# Patient Record
Sex: Female | Born: 1975 | Race: White | Hispanic: No | Marital: Married | Smoking: Never smoker
Health system: Southern US, Community
[De-identification: ages and names within clinical notes are randomized; demographics above are authoritative.]

## PROBLEM LIST (undated history)

## (undated) DIAGNOSIS — IMO0002 Reserved for concepts with insufficient information to code with codable children: Secondary | ICD-10-CM

## (undated) DIAGNOSIS — M5430 Sciatica, unspecified side: Secondary | ICD-10-CM

## (undated) DIAGNOSIS — F419 Anxiety disorder, unspecified: Secondary | ICD-10-CM

## (undated) DIAGNOSIS — M25569 Pain in unspecified knee: Secondary | ICD-10-CM

## (undated) DIAGNOSIS — F32A Depression, unspecified: Secondary | ICD-10-CM

## (undated) DIAGNOSIS — F329 Major depressive disorder, single episode, unspecified: Secondary | ICD-10-CM

## (undated) HISTORY — PX: ABDOMINAL HYSTERECTOMY: SHX81

---

## 2014-07-02 ENCOUNTER — Inpatient Hospital Stay
Admit: 2014-07-02 | Discharge: 2014-07-03 | Disposition: A | Discharge status: Home / Self Care | Payer: TRICARE (CHAMPUS) | Attending: Emergency Medicine

## 2014-07-02 DIAGNOSIS — G43101 Migraine with aura, not intractable, with status migrainosus: Secondary | ICD-10-CM

## 2014-07-02 LAB — METABOLIC PANEL, COMPREHENSIVE
A-G Ratio: 1.1 (ref 1.1–2.2)
ALT (SGPT): 19 U/L (ref 12–78)
AST (SGOT): 9 U/L — ABNORMAL LOW (ref 15–37)
Albumin: 4 g/dL (ref 3.5–5.0)
Alk. phosphatase: 57 U/L (ref 45–117)
Anion gap: 9 mmol/L (ref 5–15)
BUN/Creatinine ratio: 19 (ref 12–20)
BUN: 11 MG/DL (ref 6–20)
Bilirubin, total: 0.6 MG/DL (ref 0.2–1.0)
CO2: 25 mmol/L (ref 21–32)
Calcium: 8.9 MG/DL (ref 8.5–10.1)
Chloride: 106 mmol/L (ref 97–108)
Creatinine: 0.58 MG/DL (ref 0.55–1.02)
GFR est AA: >60 mL/min/{1.73_m2} (ref >60)
GFR est non-AA: >60 mL/min/{1.73_m2} (ref >60)
Globulin: 3.8 g/dL (ref 2.0–4.0)
Glucose: 105 mg/dL — ABNORMAL HIGH (ref 65–100)
Potassium: 3.7 mmol/L (ref 3.5–5.1)
Protein, total: 7.8 g/dL (ref 6.4–8.2)
Sodium: 140 mmol/L (ref 136–145)

## 2014-07-02 LAB — CBC WITH AUTOMATED DIFF
ABS. BASOPHILS: 0 10*3/uL (ref 0.0–0.1)
ABS. EOSINOPHILS: 0 10*3/uL (ref 0.0–0.4)
ABS. LYMPHOCYTES: 1.1 10*3/uL (ref 0.8–3.5)
ABS. MONOCYTES: 0.4 10*3/uL (ref 0.0–1.0)
ABS. NEUTROPHILS: 5.4 10*3/uL (ref 1.8–8.0)
BASOPHILS: 0 % (ref 0–1)
EOSINOPHILS: 0 % (ref 0–7)
HCT: 42.5 % (ref 35.0–47.0)
HGB: 14 g/dL (ref 11.5–16.0)
LYMPHOCYTES: 15 % (ref 12–49)
MCH: 32.4 PG (ref 26.0–34.0)
MCHC: 32.9 g/dL (ref 30.0–36.5)
MCV: 98.4 FL (ref 80.0–99.0)
MONOCYTES: 6 % (ref 5–13)
NEUTROPHILS: 79 % — ABNORMAL HIGH (ref 32–75)
PLATELET: 394 10*3/uL (ref 150–400)
RBC: 4.32 M/uL (ref 3.80–5.20)
RDW: 13.6 % (ref 11.5–14.5)
WBC: 6.9 10*3/uL (ref 3.6–11.0)

## 2014-07-02 LAB — LIPASE: Lipase: 91 U/L (ref 73–393)

## 2014-07-02 MED ORDER — KETOROLAC TROMETHAMINE 30 MG/ML INJECTION
30 mg/mL (1 mL) | INTRAMUSCULAR | Status: AC
Start: 2014-07-02 — End: 2014-07-02
  Administered 2014-07-02: via INTRAVENOUS

## 2014-07-02 MED ORDER — PROCHLORPERAZINE EDISYLATE 5 MG/ML INJECTION
5 mg/mL | Freq: Once | INTRAMUSCULAR | Status: AC
Start: 2014-07-02 — End: 2014-07-02
  Administered 2014-07-02: via INTRAVENOUS

## 2014-07-02 MED ORDER — SODIUM CHLORIDE 0.9% BOLUS IV
0.9 % | Freq: Once | INTRAVENOUS | Status: AC
Start: 2014-07-02 — End: 2014-07-02
  Administered 2014-07-02: via INTRAVENOUS

## 2014-07-02 MED ORDER — SODIUM CHLORIDE 0.9 % IV
5 mg/mL | Freq: Once | INTRAVENOUS | Status: DC
Start: 2014-07-02 — End: 2014-07-02

## 2014-07-02 MED ORDER — DIPHENHYDRAMINE HCL 50 MG/ML IJ SOLN
50 mg/mL | INTRAMUSCULAR | Status: AC
Start: 2014-07-02 — End: 2014-07-02
  Administered 2014-07-02: via INTRAVENOUS

## 2014-07-02 MED FILL — KETOROLAC TROMETHAMINE 30 MG/ML INJECTION: 30 mg/mL (1 mL) | INTRAMUSCULAR | Qty: 1

## 2014-07-02 MED FILL — SODIUM CHLORIDE 0.9 % IV: INTRAVENOUS | Qty: 1000

## 2014-07-02 MED FILL — PROCHLORPERAZINE EDISYLATE 5 MG/ML INJECTION: 5 mg/mL | INTRAMUSCULAR | Qty: 2

## 2014-07-02 MED FILL — DIPHENHYDRAMINE HCL 50 MG/ML IJ SOLN: 50 mg/mL | INTRAMUSCULAR | Qty: 1

## 2014-07-02 NOTE — ED Notes (Signed)
Pt given discharge instructions. Questions answered and pt states understanding, no distress noted, pt ambulated out of unit.

## 2014-07-02 NOTE — ED Provider Notes (Signed)
HPI Comments: 38 y.o. female with past medical history significant for migraines who presents from home with chief complaint of vomiting.  Pt states sudden onset this morning "around 0100" of nausea and vomiting.  Pt reports having constant vomiting and she states she has been unable to keeping down foods or liquids.  Pt states she feels "dehydrated" currently in the ED.  Pt states she took Zofran at home with no relief.  Pt states sudden onset "a couple minutes ago" of a HA, described as a migraine.  Pt reports having history of migraines and she states this HA feels similar to migraines she has experienced in the past.  Pt reports having allergies to onions and garlic and she believes these symptoms are associated with "something (she) ate" yesterday.  Pt denies having a history of diabetes or cancer.  Pt denies having diarrhea or fever.  There are no other acute medical concerns at this time.    Social hx: Tobacco: No, Alcohol: No    PCP: None    Note written by Juan QuamZachary A. Philippi, Neurosurgeoncribe, as dictated by Domingo CockingJeffrey T Jakub Debold, MD 6:17 PM            The history is provided by the patient and a relative.        Past Medical History   Diagnosis Date   ??? Migraine    ;     Past Surgical History   Procedure Laterality Date   ??? Hx hysterectomy           No family history on file.     History     Social History   ??? Marital Status: MARRIED     Spouse Name: N/A     Number of Children: N/A   ??? Years of Education: N/A     Occupational History   ??? Not on file.     Social History Main Topics   ??? Smoking status: Never Smoker    ??? Smokeless tobacco: Not on file   ??? Alcohol Use: No   ??? Drug Use: Not on file   ??? Sexual Activity: Not on file     Other Topics Concern   ??? Not on file     Social History Narrative   ??? No narrative on file        ALLERGIES: Penicillins      Review of Systems   Constitutional: Negative for fever and chills.   HENT: Negative for congestion and facial swelling.    Eyes: Negative for visual disturbance.    Respiratory: Negative for cough, chest tightness and shortness of breath.    Cardiovascular: Negative for chest pain.   Gastrointestinal: Positive for nausea and vomiting. Negative for abdominal pain and diarrhea.   Genitourinary: Negative for dysuria.   Musculoskeletal: Negative for arthralgias.   Skin: Negative for rash.   Neurological: Positive for headaches. Negative for dizziness.   Hematological: Negative for adenopathy.   Psychiatric/Behavioral: Negative for suicidal ideas.   All other systems reviewed and are negative.      Filed Vitals:    07/02/14 1656   BP: 119/82   Pulse: 77   Temp: 97.7 ??F (36.5 ??C)   Resp: 20   Height: 5\' 6"  (1.676 m)   Weight: 84.369 kg (186 lb)   SpO2: 99%            Physical Exam   Constitutional: She is oriented to person, place, and time. She appears well-developed and well-nourished.   Lying  on stretcher holding her head.   HENT:   Head: Normocephalic and atraumatic.   Mouth/Throat: Oropharynx is clear and moist.   Eyes: Pupils are equal, round, and reactive to light. No scleral icterus.   Neck: Normal range of motion. Neck supple. No thyromegaly present.   Cardiovascular: Normal rate, regular rhythm, normal heart sounds and intact distal pulses.    No murmur heard.  Pulmonary/Chest: Effort normal and breath sounds normal. No respiratory distress.   Abdominal: Soft. Bowel sounds are normal. She exhibits no distension. There is no tenderness.   Musculoskeletal: Normal range of motion. She exhibits no edema.   Neurological: She is alert and oriented to person, place, and time.     Mental Status:  Oriented to time, place and person.     Cranial Nerves: Intact visual fields. Fundi are benign. PERRL, EOM's full and intact, no nystagmus, no ptosis. Facial sensation is normal. Facial movement is symmetric. Palate is midline with normal sternocleidomastoid and trapezius muscle strength. Tongue is midline.     Motor:  5/5 strength in upper and lower proximal and distal muscles.  Normal bulk and tone.       Reflexes:  Biceps and quadriceps tendon reflexes 2+ and symmetrical.     Sensory:  Normal to light touch    Gait:   Normal gait.     Cerebellar:  Normal finger-to-nose and heal to shin.  Negative Romberg.       Skin: Skin is warm and dry. No rash noted. She is not diaphoretic.   Nursing note and vitals reviewed.  Note written by Juan QuamZachary A. Philippi, Neurosurgeoncribe, as dictated by Domingo CockingJeffrey T Tatianna Ibbotson, MD 6:17 PM     MDM  Number of Diagnoses or Management Options  Migraine with aura and with status migrainosus, not intractable:   Vomiting:   Diagnosis management comments: Assessment:  38 y.o. Female with no sig pmh presents with headache.  No relief from home medications.  Pt well appearing and in no distress.  Afebrile with normal VS.  No concerning features suggestive of serious intracranial pathology such as ICH, meningitis/encephalitis, tumor/mass, trauma.      DDx includes:  Migraine, tension, cluster, sinus disease, dehydration, visual disturbance, viral illness.      Plan:  IVF  Compazine  Benadryl  Toradol  Reassess        Procedures    7:29 PM  Patient resting comfortably with no complaints at this time.  VS remain stable.  Repeat physical exam is unremarkable.  Pt ambulatory without difficulty and tolerating po's well.

## 2014-07-03 MED ORDER — PROCHLORPERAZINE MALEATE 10 MG TAB
10 mg | ORAL_TABLET | Freq: Three times a day (TID) | ORAL | Status: AC | PRN
Start: 2014-07-03 — End: 2014-07-09

## 2014-08-28 HISTORY — PX: MR LOWER LEG RIGHT (ARMC HX): HXRAD1785

## 2016-01-17 ENCOUNTER — Emergency Department (HOSPITAL_COMMUNITY)
Admission: EM | Admit: 2016-01-17 | Discharge: 2016-01-17 | Disposition: A | Discharge status: Home / Self Care | Attending: Emergency Medicine | Admitting: Emergency Medicine

## 2016-01-17 ENCOUNTER — Encounter (HOSPITAL_COMMUNITY): Payer: Self-pay | Admitting: Emergency Medicine

## 2016-01-17 ENCOUNTER — Emergency Department (HOSPITAL_COMMUNITY)

## 2016-01-17 DIAGNOSIS — R200 Anesthesia of skin: Secondary | ICD-10-CM | POA: Diagnosis not present

## 2016-01-17 DIAGNOSIS — G8929 Other chronic pain: Secondary | ICD-10-CM | POA: Diagnosis not present

## 2016-01-17 DIAGNOSIS — R110 Nausea: Secondary | ICD-10-CM | POA: Insufficient documentation

## 2016-01-17 DIAGNOSIS — Y9289 Other specified places as the place of occurrence of the external cause: Secondary | ICD-10-CM | POA: Insufficient documentation

## 2016-01-17 DIAGNOSIS — S8991XA Unspecified injury of right lower leg, initial encounter: Secondary | ICD-10-CM | POA: Diagnosis present

## 2016-01-17 DIAGNOSIS — Y9389 Activity, other specified: Secondary | ICD-10-CM | POA: Diagnosis not present

## 2016-01-17 DIAGNOSIS — S8391XA Sprain of unspecified site of right knee, initial encounter: Secondary | ICD-10-CM | POA: Insufficient documentation

## 2016-01-17 DIAGNOSIS — Y998 Other external cause status: Secondary | ICD-10-CM | POA: Insufficient documentation

## 2016-01-17 DIAGNOSIS — X58XXXA Exposure to other specified factors, initial encounter: Secondary | ICD-10-CM | POA: Insufficient documentation

## 2016-01-17 DIAGNOSIS — Z8739 Personal history of other diseases of the musculoskeletal system and connective tissue: Secondary | ICD-10-CM | POA: Diagnosis not present

## 2016-01-17 DIAGNOSIS — Z880 Allergy status to penicillin: Secondary | ICD-10-CM | POA: Diagnosis not present

## 2016-01-17 HISTORY — DX: Pain in unspecified knee: M25.569

## 2016-01-17 HISTORY — DX: Reserved for concepts with insufficient information to code with codable children: IMO0002

## 2016-01-17 HISTORY — DX: Sciatica, unspecified side: M54.30

## 2016-01-17 MED ORDER — ONDANSETRON HCL 4 MG/2ML IJ SOLN
4.0000 mg | Freq: Once | INTRAMUSCULAR | Status: AC
  Administered 2016-01-17: 4 mg via INTRAVENOUS
  Filled 2016-01-17: qty 2

## 2016-01-17 MED ORDER — FENTANYL CITRATE (PF) 100 MCG/2ML IJ SOLN
100.0000 ug | Freq: Once | INTRAMUSCULAR | Status: AC
  Administered 2016-01-17: 100 ug via INTRAVENOUS
  Filled 2016-01-17: qty 2

## 2016-01-17 MED ORDER — NAPROXEN 500 MG PO TABS: 500.0000 mg | ORAL_TABLET | Freq: Two times a day (BID) | ORAL | Status: DC

## 2016-01-17 MED ORDER — ACETAMINOPHEN-CODEINE #3 300-30 MG PO TABS: 1.0000 | ORAL_TABLET | Freq: Four times a day (QID) | ORAL | Status: DC | PRN

## 2016-01-17 MED ORDER — ACETAMINOPHEN-CODEINE #3 300-30 MG PO TABS
2.0000 | ORAL_TABLET | Freq: Once | ORAL | Status: AC
  Administered 2016-01-17: 2 via ORAL
  Filled 2016-01-17: qty 2

## 2016-01-17 NOTE — Progress Notes (Signed)
Orthopedic Tech Progress Note Patient Details:  Colleen HollerChristine Hoffman Nov 16, 1975 [id number]  Ortho Devices Type of Ortho Device: Knee Immobilizer, Crutches Ortho Device/Splint Location: rle Ortho Device/Splint Interventions: Application   Colleen Hoffman 01/17/2016, 11:45 AM
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
## 2016-02-11 ENCOUNTER — Emergency Department (HOSPITAL_COMMUNITY)

## 2016-02-11 ENCOUNTER — Encounter (HOSPITAL_COMMUNITY): Payer: Self-pay

## 2016-02-11 ENCOUNTER — Emergency Department (HOSPITAL_COMMUNITY)
Admission: EM | Admit: 2016-02-11 | Discharge: 2016-02-11 | Disposition: A | Discharge status: Home / Self Care | Attending: Emergency Medicine | Admitting: Emergency Medicine

## 2016-02-11 DIAGNOSIS — Y92009 Unspecified place in unspecified non-institutional (private) residence as the place of occurrence of the external cause: Secondary | ICD-10-CM | POA: Insufficient documentation

## 2016-02-11 DIAGNOSIS — S82891A Other fracture of right lower leg, initial encounter for closed fracture: Secondary | ICD-10-CM

## 2016-02-11 DIAGNOSIS — S82831A Other fracture of upper and lower end of right fibula, initial encounter for closed fracture: Secondary | ICD-10-CM | POA: Diagnosis not present

## 2016-02-11 DIAGNOSIS — Y999 Unspecified external cause status: Secondary | ICD-10-CM | POA: Diagnosis not present

## 2016-02-11 DIAGNOSIS — Y939 Activity, unspecified: Secondary | ICD-10-CM | POA: Diagnosis not present

## 2016-02-11 DIAGNOSIS — S99911A Unspecified injury of right ankle, initial encounter: Secondary | ICD-10-CM | POA: Diagnosis present

## 2016-02-11 DIAGNOSIS — W19XXXA Unspecified fall, initial encounter: Secondary | ICD-10-CM

## 2016-02-11 DIAGNOSIS — W108XXA Fall (on) (from) other stairs and steps, initial encounter: Secondary | ICD-10-CM | POA: Diagnosis not present

## 2016-02-11 MED ORDER — HYDROCODONE-ACETAMINOPHEN 5-325 MG PO TABS: 1.0000 | ORAL_TABLET | ORAL | Status: DC | PRN

## 2016-02-11 MED ORDER — MELOXICAM 15 MG PO TABS: 15.0000 mg | ORAL_TABLET | Freq: Every day | ORAL | Status: DC | PRN

## 2016-02-11 MED ORDER — HYDROMORPHONE HCL 1 MG/ML IJ SOLN
1.0000 mg | Freq: Once | INTRAMUSCULAR | Status: AC
  Administered 2016-02-11: 1 mg via INTRAMUSCULAR
  Filled 2016-02-11: qty 1

## 2016-02-11 MED ORDER — DIAZEPAM 5 MG/ML IJ SOLN
5.0000 mg | Freq: Once | INTRAMUSCULAR | Status: AC
  Administered 2016-02-11: 5 mg via INTRAMUSCULAR
  Filled 2016-02-11: qty 2

## 2016-02-11 MED ORDER — KETOROLAC TROMETHAMINE 30 MG/ML IJ SOLN
30.0000 mg | Freq: Once | INTRAMUSCULAR | Status: AC
  Administered 2016-02-11: 30 mg via INTRAMUSCULAR
  Filled 2016-02-11: qty 1

## 2016-02-11 NOTE — ED Notes (Signed)
Paged and spoke with Ortho will arrive to place splint

## 2016-02-11 NOTE — ED Notes (Signed)
Doctor at bedside.

## 2016-02-11 NOTE — Discharge Instructions (Signed)
Cast or Splint Care °Casts and splints support injured limbs and keep bones from moving while they heal.  °HOME CARE °· Keep the cast or splint uncovered during the drying period. °¨ A plaster cast can take 24 to 48 hours to dry. °¨ A fiberglass cast will dry in less than 1 hour. °· Do not rest the cast on anything harder than a pillow for 24 hours. °· Do not put weight on your injured limb. Do not put pressure on the cast. Wait for your doctor's approval. °· Keep the cast or splint dry. °¨ Cover the cast or splint with a plastic bag during baths or wet weather. °¨ If you have a cast over your chest and belly (trunk), take sponge baths until the cast is taken off. °¨ If your cast gets wet, dry it with a towel or blow dryer. Use the cool setting on the blow dryer. °· Keep your cast or splint clean. Wash a dirty cast with a damp cloth. °· Do not put any objects under your cast or splint. °· Do not scratch the skin under the cast with an object. If itching is a problem, use a blow dryer on a cool setting over the itchy area. °· Do not trim or cut your cast. °· Do not take out the padding from inside your cast. °· Exercise your joints near the cast as told by your doctor. °· Raise (elevate) your injured limb on 1 or 2 pillows for the first 1 to 3 days. °GET HELP IF: °· Your cast or splint cracks. °· Your cast or splint is too tight or too loose. °· You itch badly under the cast. °· Your cast gets wet or has a soft spot. °· You have a bad smell coming from the cast. °· You get an object stuck under the cast. °· Your skin around the cast becomes red or sore. °· You have new or more pain after the cast is put on. °GET HELP RIGHT AWAY IF: °· You have fluid leaking through the cast. °· You cannot move your fingers or toes. °· Your fingers or toes turn blue or white or are cool, painful, or puffy (swollen). °· You have tingling or lose feeling (numbness) around the injured area. °· You have bad pain or pressure under the  cast. °· You have trouble breathing or have shortness of breath. °· You have chest pain. °  °This information is not intended to replace advice given to you by your health care provider. Make sure you discuss any questions you have with your health care provider. °  °Document Released: 12/14/2010 Document Revised: 04/16/2013 Document Reviewed: 02/20/2013 °Elsevier Interactive Patient Education ©2016 [employer] ° °
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
## 2016-02-11 NOTE — Progress Notes (Signed)
Orthopedic Tech Progress Note Patient Details:  Rod HollerChristine Schlottman 11/15/1975 [id number]  Ortho Devices Type of Ortho Device: Ace wrap, Post (short leg) splint, Stirrup splint Ortho Device/Splint Location: rle Ortho Device/Splint Interventions: Application   Brentton Wardlow 02/11/2016, 10:32 AM

## 2016-02-11 NOTE — ED Notes (Signed)
Pt presents with R ankle pain and swelling after falling down 6-7 stairs at home.  -LOC; unable to bear weight.

## 2016-02-11 NOTE — ED Notes (Signed)
Patient has own crutches.

## 2016-02-13 ENCOUNTER — Encounter (HOSPITAL_COMMUNITY): Payer: Self-pay | Admitting: Emergency Medicine

## 2016-02-13 ENCOUNTER — Emergency Department (HOSPITAL_COMMUNITY)

## 2016-02-13 ENCOUNTER — Emergency Department (HOSPITAL_COMMUNITY)
Admission: EM | Admit: 2016-02-13 | Discharge: 2016-02-13 | Disposition: A | Discharge status: Home / Self Care | Attending: Emergency Medicine | Admitting: Emergency Medicine

## 2016-02-13 DIAGNOSIS — Y999 Unspecified external cause status: Secondary | ICD-10-CM | POA: Diagnosis not present

## 2016-02-13 DIAGNOSIS — Y939 Activity, unspecified: Secondary | ICD-10-CM | POA: Diagnosis not present

## 2016-02-13 DIAGNOSIS — Y92009 Unspecified place in unspecified non-institutional (private) residence as the place of occurrence of the external cause: Secondary | ICD-10-CM | POA: Insufficient documentation

## 2016-02-13 DIAGNOSIS — S8991XD Unspecified injury of right lower leg, subsequent encounter: Secondary | ICD-10-CM | POA: Diagnosis present

## 2016-02-13 DIAGNOSIS — W19XXXD Unspecified fall, subsequent encounter: Secondary | ICD-10-CM | POA: Diagnosis not present

## 2016-02-13 DIAGNOSIS — S82301G Unspecified fracture of lower end of right tibia, subsequent encounter for closed fracture with delayed healing: Secondary | ICD-10-CM

## 2016-02-13 DIAGNOSIS — S8251XG Displaced fracture of medial malleolus of right tibia, subsequent encounter for closed fracture with delayed healing: Secondary | ICD-10-CM | POA: Insufficient documentation

## 2016-02-13 MED ORDER — KETOROLAC TROMETHAMINE 30 MG/ML IJ SOLN
30.0000 mg | Freq: Once | INTRAMUSCULAR | Status: AC
  Administered 2016-02-13: 30 mg via INTRAVENOUS
  Filled 2016-02-13: qty 1

## 2016-02-13 MED ORDER — HYDROMORPHONE HCL 1 MG/ML IJ SOLN
1.0000 mg | Freq: Once | INTRAMUSCULAR | Status: AC
  Administered 2016-02-13: 1 mg via INTRAMUSCULAR
  Filled 2016-02-13: qty 1

## 2016-02-13 MED ORDER — IBUPROFEN 400 MG PO TABS: 400.0000 mg | ORAL_TABLET | Freq: Four times a day (QID) | ORAL | Status: DC | PRN

## 2016-02-13 MED ORDER — LORAZEPAM 2 MG/ML IJ SOLN
1.0000 mg | Freq: Once | INTRAMUSCULAR | Status: DC
  Filled 2016-02-13: qty 1

## 2016-02-13 MED ORDER — LORAZEPAM 2 MG/ML IJ SOLN
0.5000 mg | Freq: Once | INTRAMUSCULAR | Status: AC
  Administered 2016-02-13: 0.5 mg via INTRAVENOUS

## 2016-02-13 MED ORDER — HYDROCODONE-ACETAMINOPHEN 5-325 MG PO TABS
2.0000 | ORAL_TABLET | Freq: Once | ORAL | Status: AC
  Administered 2016-02-13: 2 via ORAL
  Filled 2016-02-13: qty 2

## 2016-02-13 MED ORDER — DIPHENHYDRAMINE HCL 25 MG PO CAPS
25.0000 mg | ORAL_CAPSULE | Freq: Once | ORAL | Status: AC
  Administered 2016-02-13: 25 mg via ORAL
  Filled 2016-02-13: qty 1

## 2016-02-13 MED ORDER — ETOMIDATE 2 MG/ML IV SOLN
12.0000 mg | Freq: Once | INTRAVENOUS | Status: AC
  Administered 2016-02-13: 12 mg via INTRAVENOUS
  Filled 2016-02-13: qty 10

## 2016-02-13 MED ORDER — LORAZEPAM 2 MG/ML IJ SOLN
INTRAMUSCULAR | Status: DC | PRN
  Administered 2016-02-13: 0.5 mg via INTRAVENOUS

## 2016-02-13 MED ORDER — HYDROCODONE-ACETAMINOPHEN 5-325 MG PO TABS: 1.0000 | ORAL_TABLET | ORAL | Status: DC | PRN

## 2016-02-13 NOTE — Sedation Documentation (Signed)
Patient is resting comfortably. 
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
## 2016-02-13 NOTE — Progress Notes (Signed)
Orthopedic Tech Progress Note Patient Details:  Rod HollerChristine Labell [birth year] [id number] Assisted MD with reduction, then placed posterior fiberglass short leg splint with fiberglass stirrup splint to RLE.  Pulses, sensation, motion intact before and after splinting.  Capillary refill less than 2 seconds before and after splinting. Ortho Devices Type of Ortho Device: Post (short leg) splint, Stirrup splint Ortho Device/Splint Location: RLE Ortho Device/Splint Interventions: Application   Lesle ChrisGilliland, Emelee Rodocker L 02/13/2016, 9:16 PM

## 2016-02-13 NOTE — Sedation Documentation (Signed)
Vital signs stable. 

## 2016-02-13 NOTE — ED Notes (Signed)
Pt sts increased pain in right ankle at home and fall x 2; pt sts seen here Friday with fracture to right ankle

## 2016-02-13 NOTE — Discharge Instructions (Signed)
°Cast or Splint Care  ° ° °Casts and splints support injured limbs and keep bones from moving while they heal. It is important to care for your cast or splint at home.  °HOME CARE INSTRUCTIONS  °Keep the cast or splint uncovered during the drying period. It can take 24 to 48 hours to dry if it is made of plaster. A fiberglass cast will dry in less than 1 hour.  °Do not rest the cast on anything harder than a pillow for the first 24 hours.  °Do not put weight on your injured limb or apply pressure to the cast until your health care provider gives you permission.  °Keep the cast or splint dry. Wet casts or splints can lose their shape and may not support the limb as well. A wet cast that has lost its shape can also create harmful pressure on your skin when it dries. Also, wet skin can become infected.  °Cover the cast or splint with a plastic bag when bathing or when out in the rain or snow. If the cast is on the trunk of the body, take sponge baths until the cast is removed.  °If your cast does become wet, dry it with a towel or a blow dryer on the cool setting only. °Keep your cast or splint clean. Soiled casts may be wiped with a moistened cloth.  °Do not place any hard or soft foreign objects under your cast or splint, such as cotton, toilet paper, lotion, or powder.  °Do not try to scratch the skin under the cast with any object. The object could get stuck inside the cast. Also, scratching could lead to an infection. If itching is a problem, use a blow dryer on a cool setting to relieve discomfort.  °Do not trim or cut your cast or remove padding from inside of it.  °Exercise all joints next to the injury that are not immobilized by the cast or splint. For example, if you have a long leg cast, exercise the hip joint and toes. If you have an arm cast or splint, exercise the shoulder, elbow, thumb, and fingers.  °Elevate your injured arm or leg on 1 or 2 pillows for the first 1 to 3 days to decrease swelling and  pain. It is best if you can comfortably elevate your cast so it is higher than your heart. °SEEK MEDICAL CARE IF:  °Your cast or splint cracks.  °Your cast or splint is too tight or too loose.  °You have unbearable itching inside the cast.  °Your cast becomes wet or develops a soft spot or area.  °You have a bad smell coming from inside your cast.  °You get an object stuck under your cast.  °Your skin around the cast becomes red or raw.  °You have new pain or worsening pain after the cast has been applied. °SEEK IMMEDIATE MEDICAL CARE IF:  °You have fluid leaking through the cast.  °You are unable to move your fingers or toes.  °You have discolored (blue or white), cool, painful, or very swollen fingers or toes beyond the cast.  °You have tingling or numbness around the injured area.  °You have severe pain or pressure under the cast.  °You have any difficulty with your breathing or have shortness of breath.  °You have chest pain. °This information is not intended to replace advice given to you by your health care provider. Make sure you discuss any questions you have with your health care provider.  °  Document Released: 08/11/2000 Document Revised: 06/04/2013 Document Reviewed: 02/20/2013  °Elsevier Interactive Patient Education ©2016 [employer]  ° °

## 2017-06-06 IMAGING — CR DG ANKLE COMPLETE 3+V*R*
3 series · 3 of 3 positions shown · non-contrast
Comparison: 02/11/2016

CLINICAL DATA: Injured right ankle 2 days ago.

EXAM:
RIGHT ANKLE - COMPLETE 3+ VIEW

[ankle ap]
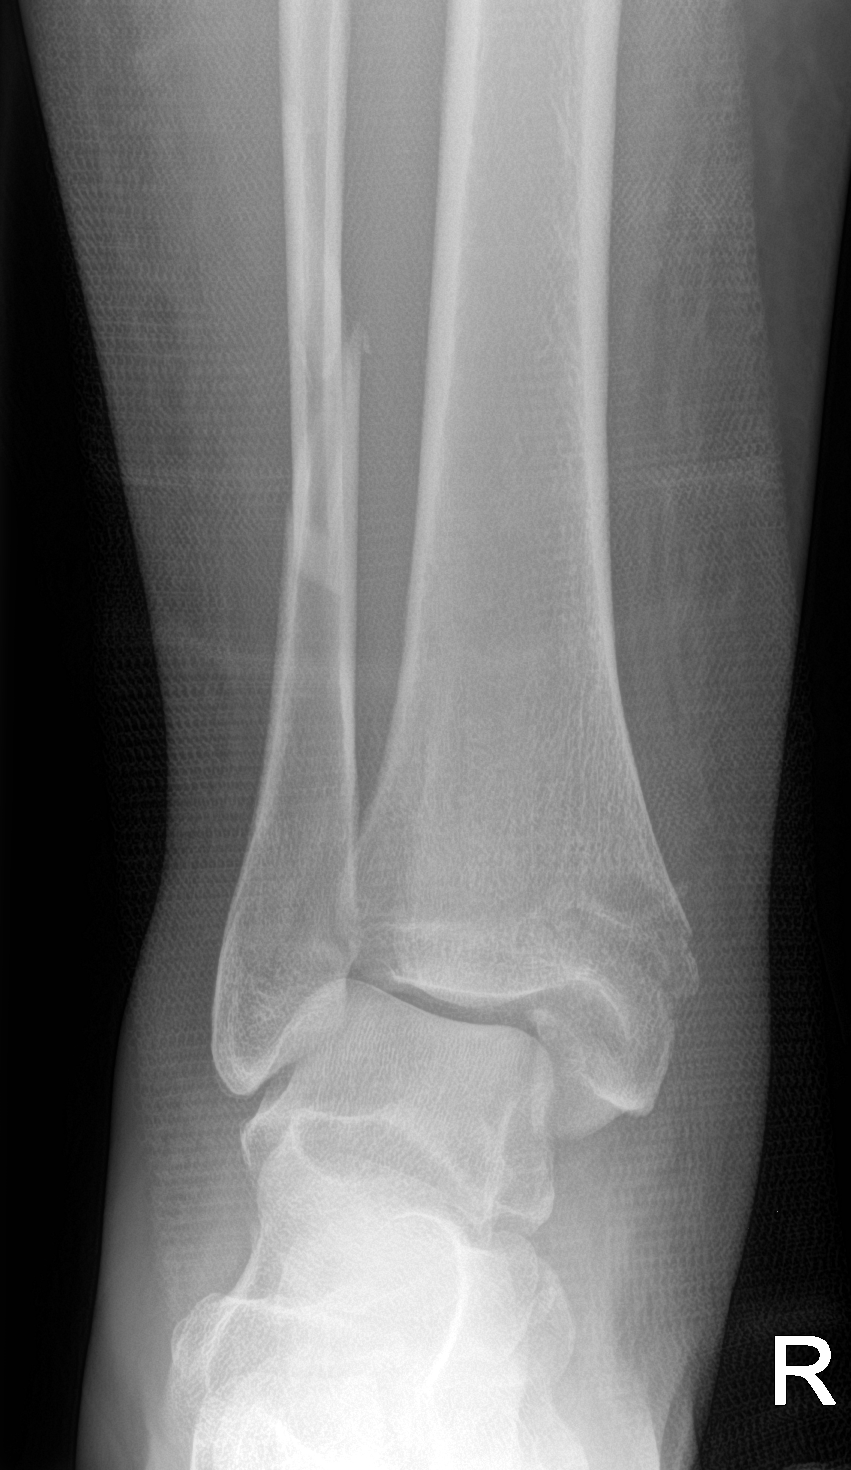

[ankle obl]
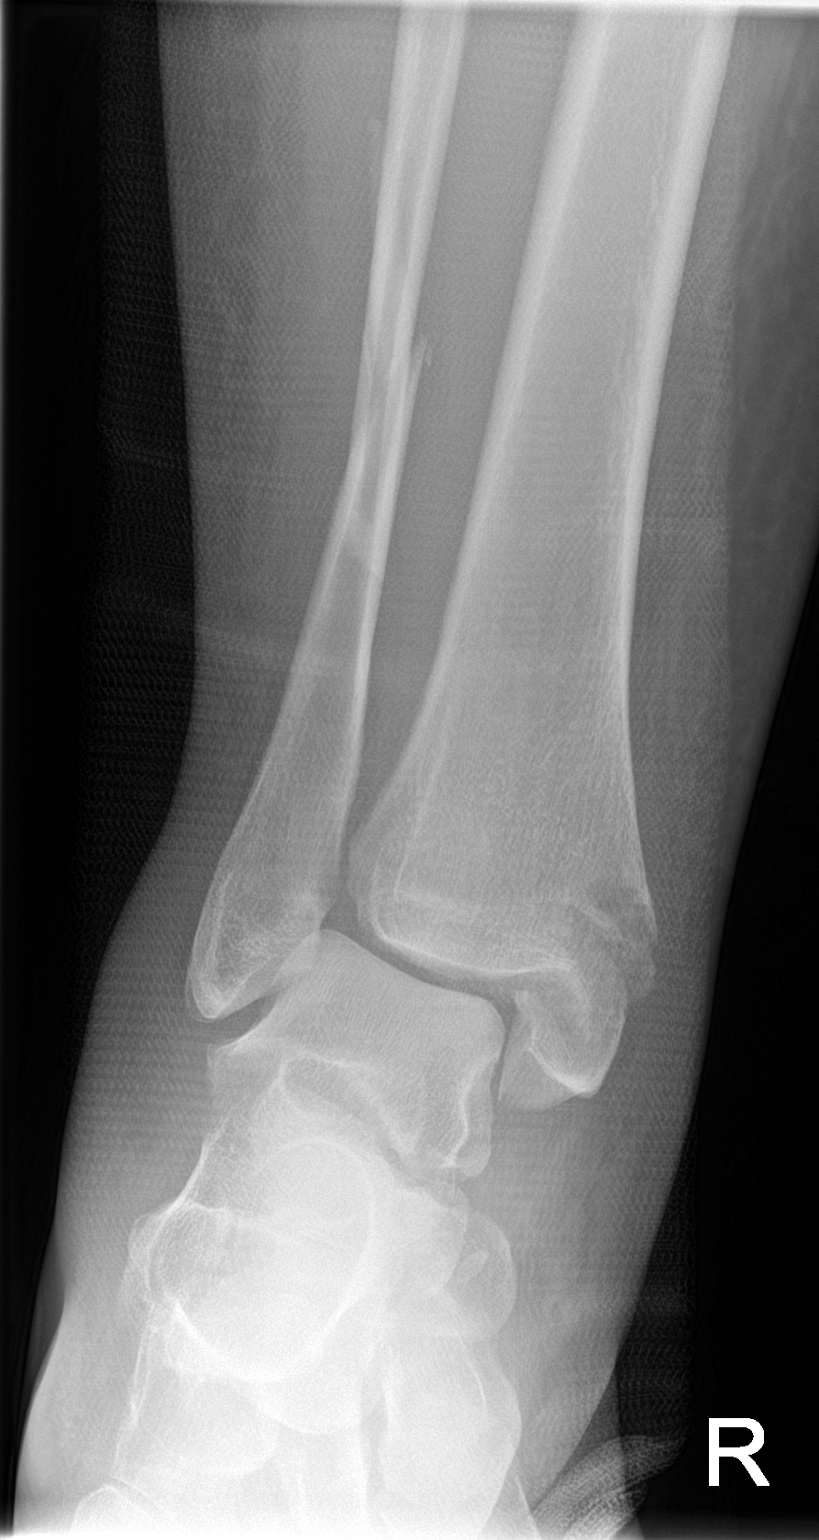

[ankle lat]
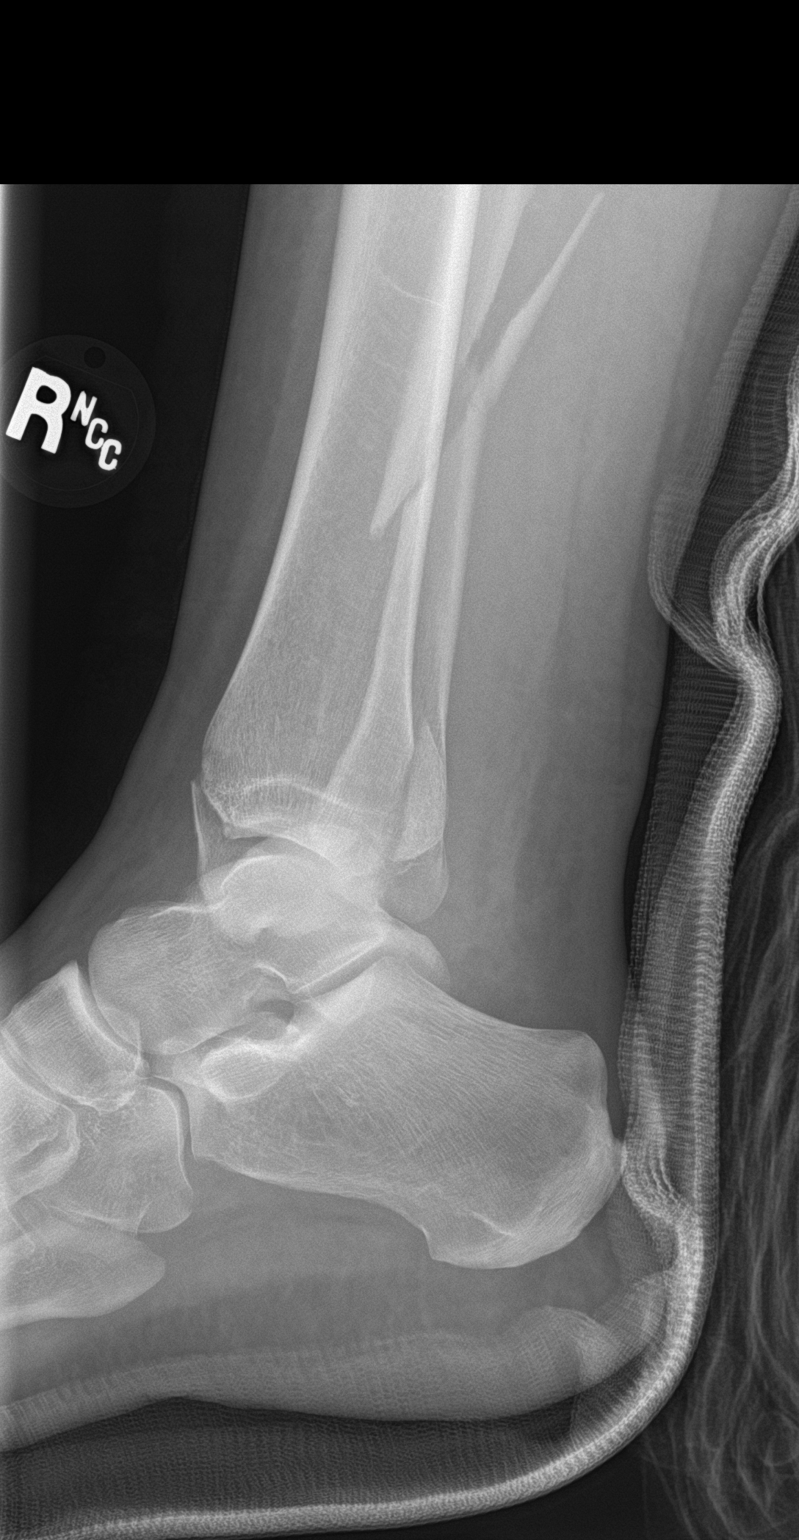

[3 of 3 positions shown; findings below may reference images not displayed]

FINDINGS: There is an overlying cast obscuring evaluation of bony detail. Exam
again demonstrates patient's displaced oblique fracture of the
distal fibular diaphysis. There is mild increased posterior
displacement of a fragment at this fracture site. There is evidence
of patient's displaced medial malleolar fracture with slight
increased displacement compared to the previous exam. Slight
increased widening of the medial aspect of the ankle mortise
compared to the previous exam. There is evidence of a posterior
malleolar fracture which was present, but not well visualized on
previous exam.
IMPRESSION: Slight worsening displacement of posterior fracture fragment
associated with patient's distal fibular diaphyseal fracture. Slight
worsening displacement of known medial malleolar fracture with
slight increased widening of the medial ankle mortise.

Displaced posterior malleolar fracture.

## 2018-03-28 ENCOUNTER — Encounter (HOSPITAL_BASED_OUTPATIENT_CLINIC_OR_DEPARTMENT_OTHER): Payer: Self-pay | Admitting: *Deleted

## 2018-03-28 ENCOUNTER — Other Ambulatory Visit: Payer: Self-pay

## 2018-04-03 ENCOUNTER — Other Ambulatory Visit: Payer: Self-pay | Admitting: Anesthesiology

## 2018-04-05 ENCOUNTER — Ambulatory Visit (HOSPITAL_BASED_OUTPATIENT_CLINIC_OR_DEPARTMENT_OTHER): Payer: Commercial Managed Care - PPO | Admitting: Certified Registered"

## 2018-04-05 ENCOUNTER — Encounter (HOSPITAL_BASED_OUTPATIENT_CLINIC_OR_DEPARTMENT_OTHER): Payer: Self-pay | Admitting: Certified Registered"

## 2018-04-05 ENCOUNTER — Ambulatory Visit (HOSPITAL_BASED_OUTPATIENT_CLINIC_OR_DEPARTMENT_OTHER)
Admission: RE | Admit: 2018-04-05 | Discharge: 2018-04-05 | Disposition: A | Discharge status: Home / Self Care | Payer: Commercial Managed Care - PPO | Source: Ambulatory Visit | Attending: Anesthesiology | Admitting: Anesthesiology

## 2018-04-05 ENCOUNTER — Ambulatory Visit (HOSPITAL_COMMUNITY): Payer: Commercial Managed Care - PPO

## 2018-04-05 ENCOUNTER — Other Ambulatory Visit: Payer: Self-pay

## 2018-04-05 ENCOUNTER — Encounter (HOSPITAL_BASED_OUTPATIENT_CLINIC_OR_DEPARTMENT_OTHER)
Admission: RE | Disposition: A | Discharge status: Home / Self Care | Payer: Self-pay | Source: Ambulatory Visit | Attending: Anesthesiology

## 2018-04-05 DIAGNOSIS — Z419 Encounter for procedure for purposes other than remedying health state, unspecified: Secondary | ICD-10-CM

## 2018-04-05 DIAGNOSIS — Z888 Allergy status to other drugs, medicaments and biological substances status: Secondary | ICD-10-CM | POA: Insufficient documentation

## 2018-04-05 DIAGNOSIS — G90521 Complex regional pain syndrome I of right lower limb: Secondary | ICD-10-CM | POA: Insufficient documentation

## 2018-04-05 DIAGNOSIS — Z885 Allergy status to narcotic agent status: Secondary | ICD-10-CM | POA: Diagnosis not present

## 2018-04-05 DIAGNOSIS — Z880 Allergy status to penicillin: Secondary | ICD-10-CM | POA: Insufficient documentation

## 2018-04-05 DIAGNOSIS — Z6835 Body mass index (BMI) 35.0-35.9, adult: Secondary | ICD-10-CM | POA: Diagnosis not present

## 2018-04-05 DIAGNOSIS — Z79899 Other long term (current) drug therapy: Secondary | ICD-10-CM | POA: Insufficient documentation

## 2018-04-05 HISTORY — PX: SPINAL CORD STIMULATOR TRIAL: SHX5380

## 2018-04-05 SURGERY — LUMBAR SPINAL CORD STIMULATOR TRIAL
Anesthesia: Monitor Anesthesia Care | Site: Back

## 2018-04-05 MED ORDER — MIDAZOLAM HCL 2 MG/2ML IJ SOLN
1.0000 mg | INTRAMUSCULAR | Status: DC | PRN
  Administered 2018-04-05 (×2): 1 mg via INTRAVENOUS

## 2018-04-05 MED ORDER — FENTANYL CITRATE (PF) 100 MCG/2ML IJ SOLN: 25.0000 ug | INTRAMUSCULAR | Status: DC | PRN

## 2018-04-05 MED ORDER — BUPIVACAINE-EPINEPHRINE (PF) 0.25% -1:200000 IJ SOLN
INTRAMUSCULAR | Status: DC | PRN
  Administered 2018-04-05: 10 mL

## 2018-04-05 MED ORDER — PROPOFOL 10 MG/ML IV BOLUS
INTRAVENOUS | Status: DC | PRN
  Administered 2018-04-05 (×24): 10 mg via INTRAVENOUS

## 2018-04-05 MED ORDER — HYDROCODONE-ACETAMINOPHEN 7.5-325 MG PO TABS: 1.0000 | ORAL_TABLET | Freq: Once | ORAL | Status: DC | PRN

## 2018-04-05 MED ORDER — FENTANYL CITRATE (PF) 100 MCG/2ML IJ SOLN
50.0000 ug | INTRAMUSCULAR | Status: DC | PRN
  Administered 2018-04-05 (×2): 25 ug via INTRAVENOUS

## 2018-04-05 MED ORDER — VANCOMYCIN HCL IN DEXTROSE 1-5 GM/200ML-% IV SOLN: 1000.0000 mg | INTRAVENOUS | Status: DC

## 2018-04-05 MED ORDER — HYDROCODONE-ACETAMINOPHEN 5-325 MG PO TABS: 1.0000 | ORAL_TABLET | ORAL | 0 refills | Status: DC | PRN

## 2018-04-05 MED ORDER — BUPIVACAINE-EPINEPHRINE (PF) 0.5% -1:200000 IJ SOLN
INTRAMUSCULAR | Status: AC
  Filled 2018-04-05: qty 30

## 2018-04-05 MED ORDER — LACTATED RINGERS IV SOLN
INTRAVENOUS | Status: DC
  Administered 2018-04-05 (×2): via INTRAVENOUS

## 2018-04-05 MED ORDER — CHLORHEXIDINE GLUCONATE CLOTH 2 % EX PADS: 6.0000 | MEDICATED_PAD | Freq: Once | CUTANEOUS | Status: DC

## 2018-04-05 MED ORDER — MIDAZOLAM HCL 2 MG/2ML IJ SOLN
INTRAMUSCULAR | Status: AC
  Filled 2018-04-05: qty 2

## 2018-04-05 MED ORDER — ONDANSETRON HCL 4 MG/2ML IJ SOLN
INTRAMUSCULAR | Status: DC | PRN
  Administered 2018-04-05: 4 mg via INTRAVENOUS

## 2018-04-05 MED ORDER — SODIUM CHLORIDE 0.9 % IJ SOLN
INTRAMUSCULAR | Status: AC
  Filled 2018-04-05: qty 10

## 2018-04-05 MED ORDER — MELOXICAM 15 MG PO TABS: 15.0000 mg | ORAL_TABLET | Freq: Every day | ORAL | 0 refills | Status: DC | PRN

## 2018-04-05 MED ORDER — LIDOCAINE-EPINEPHRINE 1 %-1:100000 IJ SOLN
INTRAMUSCULAR | Status: AC
  Filled 2018-04-05: qty 1

## 2018-04-05 MED ORDER — FENTANYL CITRATE (PF) 100 MCG/2ML IJ SOLN
INTRAMUSCULAR | Status: AC
  Filled 2018-04-05: qty 2

## 2018-04-05 MED ORDER — SCOPOLAMINE 1 MG/3DAYS TD PT72: 1.0000 | MEDICATED_PATCH | Freq: Once | TRANSDERMAL | Status: DC | PRN

## 2018-04-05 SURGICAL SUPPLY — 28 items
BENZOIN TINCTURE PRP APPL 2/3 (GAUZE/BANDAGES/DRESSINGS) IMPLANT
BLADE CLIPPER SURG (BLADE) IMPLANT
CHLORAPREP W/TINT 26ML (MISCELLANEOUS) ×2 IMPLANT
DRAPE C-ARM 42X72 X-RAY (DRAPES) ×2 IMPLANT
DRAPE C-ARMOR (DRAPES) ×2 IMPLANT
DRAPE LAPAROTOMY 100X72 PEDS (DRAPES) ×2 IMPLANT
DRAPE SURG 17X23 STRL (DRAPES) ×2 IMPLANT
DRSG TEGADERM 2-3/8X2-3/4 SM (GAUZE/BANDAGES/DRESSINGS) IMPLANT
DRSG TEGADERM 4X4.75 (GAUZE/BANDAGES/DRESSINGS) ×4 IMPLANT
FORCEPS BIPOLAR SPETZLER 8 1.0 (NEUROSURGERY SUPPLIES) IMPLANT
GAUZE 4X4 16PLY RFD (DISPOSABLE) ×2 IMPLANT
GLOVE BIOGEL PI IND STRL 7.5 (GLOVE) ×1 IMPLANT
GLOVE BIOGEL PI INDICATOR 7.5 (GLOVE) ×1
GLOVE ECLIPSE 7.5 STRL STRAW (GLOVE) ×2 IMPLANT
GOWN STRL REUS W/ TWL LRG LVL3 (GOWN DISPOSABLE) ×1 IMPLANT
GOWN STRL REUS W/TWL 2XL LVL3 (GOWN DISPOSABLE) ×2 IMPLANT
GOWN STRL REUS W/TWL LRG LVL3 (GOWN DISPOSABLE) ×1
KIT AXIUM LEAD 50CM SLIM TIP (KITS) ×2 IMPLANT
NEEDLE HYPO 25X1 1.5 SAFETY (NEEDLE) ×2 IMPLANT
PACK BASIN DAY SURGERY FS (CUSTOM PROCEDURE TRAY) ×2 IMPLANT
PAD ARMBOARD 7.5X6 YLW CONV (MISCELLANEOUS) ×2 IMPLANT
SLEEVE SCD COMPRESS KNEE MED (MISCELLANEOUS) IMPLANT
SPONGE GAUZE 2X2 8PLY STRL LF (GAUZE/BANDAGES/DRESSINGS) IMPLANT
SPONGE LAP 4X18 RFD (DISPOSABLE) ×2 IMPLANT
STRIP CLOSURE SKIN 1/2X4 (GAUZE/BANDAGES/DRESSINGS) IMPLANT
SYR 10ML LL (SYRINGE) IMPLANT
SYR CONTROL 10ML LL (SYRINGE) ×2 IMPLANT
TOWEL GREEN STERILE FF (TOWEL DISPOSABLE) ×4 IMPLANT

## 2018-04-05 NOTE — Op Note (Signed)
1.CRPS of the right LE    Post-op. Diagnosis:    Same as preop    Operation:    1. Spinal stimulator with leads placed at S1 on the right,L5 on the right.    Anesthesia:  Local with MAC    Indications:  Chronic pain, CRPS, refractory to medication management and previous interventional/surgical management.    Details of Procedure:  An informed and written consent was reviewed with the patient prior to proceeding with an outpatient placement of a spinal stimulator trial with two leads, with the risks of this procedure fully disclosed including but not limited to the risk of infection, bleeding, increased pain, dural puncture, post dural puncture headache and neurological sequel, stroke and death. The patient verbalized understanding of the described procedure and informed written consent was signed and placed in the patient's permanent medical record to proceed at this time. The patient was given 1 gram of IV Ancef prior to the procedure for antibiotic prophylaxis.   The patient was transported to the operating room and positioned in the prone position. All pressure points were checked and padded prior to the procedure. Anesthesia was provided with monitored anesthesia care. The area overlying thelumbar spine, sacrum, and buttockswas aseptically prepared in a normal sterile fashion and draped. Local anesthesia of the skin was obtained and anesthesia of the subcutaneous tissue was obtained.   A 15-gauge Tuohy needle which was advanced into the neural foramina as follows. The spinal stimulator leads were threaded through separate introducer needles into the epidural space and placed appropriately on the right at S1, and right at the L5 levels. At this point intraoperative trialing of the spinal stimulator leads  was performed and was determined to be effectively covering the areas targeted.Steri-Strips and tegaderm dressings were applied.   Final fluoroscopic images were retained  for the patient's permanent medical record demonstrating placement of two spinal stimulator leads within the epidural space on the right at L5, right at S1 levels. AP and lateral views were obtained of the leadsobtained for the patient's permanent medical record.   The patient was then transported to the [facility] in stable condition with an intact neurological evaluation. After adequately recovering from the monitored anesthesia care, the patient spinal stimulator was programmed and optimized to best cover the areas of pain in the dermatomal distributions of the pain described preoperatively. The patient was then discharged to home with a followup visit for incision/wound check in 7 days .    Specimens:  None    Complications:  None    Findings:  AP and lateral views of this patient's lumbar spine demonstrate placement of  Four leads were utilized because the leads are specifically placed to directly apply energy to the cell bodies within the dorsal root ganglion (DRG). This direct application of energy to the dorsal root ganglion (DRG) at specific levels effectively treats the patient's specific and focal pain associated with dermatome distribution. The battery utilized to supply energy to these leads placed has four ports to be utilized to maximize patient improvement and optimize procedure outcome. To maximize patient benefit and fully take advantage of the technology provided, the use of all four ports in the battery is considered medically reasonable and necessary based on patient requirements.

## 2018-04-05 NOTE — Discharge Instructions (Addendum)
Dr. Ollen BowlHarkins Post-Op Orders   Ice Pack - 20 minutes on (in a pillow case), and 20 minutes off. Wear the ice pack UNDER the binder.  Follow up in office, they will call you for an appointment in 10 days to 2 weeks.  Increase activity gradually.    No lifting anything heavier than a gallon of milk (10 pounds) until seen in the office.  Advance diet slowly as tolerated.  Dressing care:  Keep dressing dry; sponge bath  Call for fever, drainage, and redness.  No swimming or bathing in a bathtub (do not get into standing water).    Post Anesthesia Home Care Instructions  Activity: Get plenty of rest for the remainder of the day. A responsible individual must stay with you for 24 hours following the procedure.  For the next 24 hours, DO NOT: -Drive a car -Advertising copywriterperate machinery -Drink alcoholic beverages -Take any medication unless instructed by your physician -Make any legal decisions or sign important papers.  Meals: Start with liquid foods such as gelatin or soup. Progress to regular foods as tolerated. Avoid greasy, spicy, heavy foods. If nausea and/or vomiting occur, drink only clear liquids until the nausea and/or vomiting subsides. Call your physician if vomiting continues.  Special Instructions/Symptoms: Your throat may feel dry or sore from the anesthesia or the breathing tube placed in your throat during surgery. If this causes discomfort, gargle with warm salt water. The discomfort should disappear within 24 hours.  If you had a scopolamine patch placed behind your ear for the management of post- operative nausea and/or vomiting:  1. The medication in the patch is effective for 72 hours, after which it should be removed.  Wrap patch in a tissue and discard in the trash. Wash hands thoroughly with soap and water. 2. You may remove the patch earlier than 72 hours if you experience unpleasant side effects which may include dry mouth, dizziness or visual disturbances. 3. Avoid  touching the patch. Wash your hands with soap and water after contact with the patch.
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
## 2018-04-05 NOTE — Transfer of Care (Signed)
Immediate Anesthesia Transfer of Care Note  Patient: Colleen Hoffman  Procedure(s) Performed: DORSAL ROOT GANGLION TRIAL (N/A Back)  Patient Location: PACU  Anesthesia Type:MAC  Level of Consciousness: awake, alert , oriented and patient cooperative  Airway & Oxygen Therapy: Patient Spontanous Breathing  Post-op Assessment: Report given to RN and Post -op Vital signs reviewed and stable  Post vital signs: Reviewed and stable  Last Vitals:  Vitals Value Taken Time  BP    Temp    Pulse    Resp    SpO2      Last Pain:  Vitals:   04/05/18 1610  TempSrc: Oral  PainSc: 7       Patients Stated Pain Goal: 5 (96/04/54 0981)  Complications: No apparent anesthesia complications

## 2018-04-05 NOTE — Anesthesia Postprocedure Evaluation (Signed)
Anesthesia Post Note  Patient: Colleen Hoffman  Procedure(s) Performed: DORSAL ROOT GANGLION TRIAL (N/A Back)     Patient location during evaluation: PACU Anesthesia Type: MAC Level of consciousness: awake and alert Pain management: pain level controlled Vital Signs Assessment: post-procedure vital signs reviewed and stable Respiratory status: spontaneous breathing, nonlabored ventilation, respiratory function stable and patient connected to nasal cannula oxygen Cardiovascular status: stable and blood pressure returned to baseline Postop Assessment: no apparent nausea or vomiting Anesthetic complications: no    Last Vitals:  Vitals:   04/05/18 1000 04/05/18 1024  BP: 120/80 120/73  Pulse: 66 64  Resp: 18 16  Temp:  36.6 C  SpO2: 98% 98%    Last Pain:  Vitals:   04/05/18 1024  TempSrc:   PainSc: 7                  Vonita Calloway
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
## 2018-04-05 NOTE — Anesthesia Procedure Notes (Signed)
Procedure Name: MAC Date/Time: 04/05/2018 7:30 AM Performed by: Signe Colt, CRNA Pre-anesthesia Checklist: Patient identified, Timeout performed, Emergency Drugs available, Suction available and Patient being monitored Patient Re-evaluated:Patient Re-evaluated prior to induction Oxygen Delivery Method: Simple face mask

## 2018-04-08 ENCOUNTER — Encounter (HOSPITAL_BASED_OUTPATIENT_CLINIC_OR_DEPARTMENT_OTHER): Payer: Self-pay | Admitting: Anesthesiology

## 2018-04-16 ENCOUNTER — Other Ambulatory Visit: Payer: Self-pay | Admitting: Anesthesiology

## 2018-05-06 ENCOUNTER — Encounter (HOSPITAL_COMMUNITY)
Admission: RE | Admit: 2018-05-06 | Discharge: 2018-05-06 | Disposition: A | Discharge status: Home / Self Care | Payer: Commercial Managed Care - PPO | Source: Ambulatory Visit | Attending: Anesthesiology | Admitting: Anesthesiology

## 2018-05-06 ENCOUNTER — Encounter (HOSPITAL_COMMUNITY): Payer: Self-pay

## 2018-05-06 DIAGNOSIS — Z01812 Encounter for preprocedural laboratory examination: Secondary | ICD-10-CM | POA: Insufficient documentation

## 2018-05-06 HISTORY — DX: Depression, unspecified: F32.A

## 2018-05-06 HISTORY — DX: Major depressive disorder, single episode, unspecified: F32.9

## 2018-05-06 HISTORY — DX: Anxiety disorder, unspecified: F41.9

## 2018-05-06 LAB — CBC
HCT: 43.3 % (ref 36.0–46.0)
HEMOGLOBIN: 14 g/dL (ref 12.0–15.0)
MCH: 32.1 pg (ref 26.0–34.0)
MCHC: 32.3 g/dL (ref 30.0–36.0)
MCV: 99.3 fL (ref 78.0–100.0)
Platelets: 303 10*3/uL (ref 150–400)
RBC: 4.36 MIL/uL (ref 3.87–5.11)
RDW: 13.2 % (ref 11.5–15.5)
WBC: 7.9 10*3/uL (ref 4.0–10.5)

## 2018-05-06 LAB — BASIC METABOLIC PANEL
Anion gap: 10 (ref 5–15)
BUN: 15 mg/dL (ref 6–20)
CHLORIDE: 107 mmol/L (ref 98–111)
CO2: 22 mmol/L (ref 22–32)
CREATININE: 0.62 mg/dL (ref 0.44–1.00)
Calcium: 9.1 mg/dL (ref 8.9–10.3)
GFR calc non Af Amer: >60 mL/min (ref >60)
Glucose, Bld: 90 mg/dL (ref 70–99)
Potassium: 4.1 mmol/L (ref 3.5–5.1)
Sodium: 139 mmol/L (ref 135–145)

## 2018-05-06 LAB — SURGICAL PCR SCREEN
MRSA, PCR: NEGATIVE
Staphylococcus aureus: NEGATIVE

## 2018-05-06 MED ORDER — CHLORHEXIDINE GLUCONATE CLOTH 2 % EX PADS: 6.0000 | MEDICATED_PAD | Freq: Once | CUTANEOUS | Status: DC

## 2018-05-06 NOTE — Pre-Procedure Instructions (Signed)
Colleen Hoffman  05/06/2018      [facility] - [place], [place] - [address] [place] [address] Phone: [phone] Fax: [phone]  [id number] - [place], [place] - [address] [place] [address] Phone: [phone] Fax: [phone]    Your procedure is scheduled on 05/10/18.  Report to [facility] Admitting at 530 A.M.  Call this number if you have problems the morning of surgery:  [phone]   Remember:  Do not eat or drink after midnight.     Take these medicines the morning of surgery with A SIP OF WATER ---hydrocodone    Do not wear jewelry, make-up or nail polish.  Do not wear lotions, powders, or perfumes, or deodorant.  Do not shave 48 hours prior to surgery.  Men may shave face and neck.  Do not bring valuables to the hospital.  [facility] is not responsible for any belongings or valuables.  Contacts, dentures or bridgework may not be worn into surgery.  Leave your suitcase in the car.  After surgery it may be brought to your room.  For patients admitted to the hospital, discharge time will be determined by your treatment team.  Patients discharged the day of surgery will not be allowed to drive home.   Name and phone number of your driver:   Do not take any aspirin,anti-inflammatories,vitamins,or herbal supplements 5-7 days prior to surgery. Special instructions:  [facility] - Preparing for Surgery  Before surgery, you can play an important role.  Because skin is not sterile, your skin needs to be as free of germs as possible.  You can reduce the number of germs on you skin by washing with CHG (chlorahexidine gluconate) soap before surgery.  CHG is an antiseptic cleaner which kills germs and bonds with the skin to continue killing germs even after washing.  Oral Hygiene is also important in reducing the risk of infection.  Remember to brush your teeth with  your regular toothpaste the morning of surgery.  Please DO NOT use if you have an allergy to CHG or antibacterial soaps.  If your skin becomes reddened/irritated stop using the CHG and inform your nurse when you arrive at Short Stay.  Do not shave (including legs and underarms) for at least 48 hours prior to the first CHG shower.  You may shave your face.  Please follow these instructions carefully:   1.  Shower with CHG Soap the night before surgery and the morning of Surgery.  2.  If you choose to wash your hair, wash your hair first as usual with your normal shampoo.  3.  After you shampoo, rinse your hair and body thoroughly to remove the shampoo. 4.  Use CHG as you would any other liquid soap.  You can apply chg directly to the skin and wash gently with a      scrungie or washcloth.           5.  Apply the CHG Soap to your body ONLY FROM THE NECK DOWN.   Do not use on open wounds or open sores. Avoid contact with your eyes, ears, mouth and genitals (private parts).  Wash genitals (private parts) with your normal soap.  6.  Wash thoroughly, paying special attention to the area where your surgery will be performed.  7.  Thoroughly rinse your body with warm water from the neck down.  8.  DO NOT shower/wash with your  normal soap after using and rinsing off the CHG Soap.  9.  Pat yourself dry with a clean towel.            10.  Wear clean pajamas.            11.  Place clean sheets on your bed the night of your first shower and do not sleep with pets.  Day of Surgery  Do not apply any lotions/deoderants the morning of surgery.   Please wear clean clothes to the hospital/surgery center. Remember to brush your teeth with toothpaste.    Please read over the following fact sheets that you were given.

## 2018-05-09 MED ORDER — VANCOMYCIN HCL 10 G IV SOLR
1500.0000 mg | INTRAVENOUS | Status: AC
  Administered 2018-05-10 (×2): 1500 mg via INTRAVENOUS
  Filled 2018-05-09: qty 1500
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
## 2018-05-10 ENCOUNTER — Ambulatory Visit (HOSPITAL_COMMUNITY): Payer: Commercial Managed Care - PPO | Admitting: Anesthesiology

## 2018-05-10 ENCOUNTER — Ambulatory Visit (HOSPITAL_COMMUNITY): Payer: Commercial Managed Care - PPO

## 2018-05-10 ENCOUNTER — Ambulatory Visit (HOSPITAL_COMMUNITY)
Admission: RE | Admit: 2018-05-10 | Discharge: 2018-05-10 | Disposition: A | Discharge status: Home / Self Care | Payer: Commercial Managed Care - PPO | Source: Ambulatory Visit | Attending: Anesthesiology | Admitting: Anesthesiology

## 2018-05-10 ENCOUNTER — Encounter (HOSPITAL_COMMUNITY): Payer: Self-pay

## 2018-05-10 ENCOUNTER — Encounter (HOSPITAL_COMMUNITY)
Admission: RE | Disposition: A | Discharge status: Home / Self Care | Payer: Self-pay | Source: Ambulatory Visit | Attending: Anesthesiology

## 2018-05-10 DIAGNOSIS — F419 Anxiety disorder, unspecified: Secondary | ICD-10-CM | POA: Insufficient documentation

## 2018-05-10 DIAGNOSIS — E669 Obesity, unspecified: Secondary | ICD-10-CM | POA: Diagnosis not present

## 2018-05-10 DIAGNOSIS — Z6835 Body mass index (BMI) 35.0-35.9, adult: Secondary | ICD-10-CM | POA: Insufficient documentation

## 2018-05-10 DIAGNOSIS — Z880 Allergy status to penicillin: Secondary | ICD-10-CM | POA: Insufficient documentation

## 2018-05-10 DIAGNOSIS — F329 Major depressive disorder, single episode, unspecified: Secondary | ICD-10-CM | POA: Diagnosis not present

## 2018-05-10 DIAGNOSIS — Z79899 Other long term (current) drug therapy: Secondary | ICD-10-CM | POA: Diagnosis not present

## 2018-05-10 DIAGNOSIS — G90521 Complex regional pain syndrome I of right lower limb: Secondary | ICD-10-CM

## 2018-05-10 DIAGNOSIS — Z885 Allergy status to narcotic agent status: Secondary | ICD-10-CM | POA: Diagnosis not present

## 2018-05-10 HISTORY — PX: SPINAL CORD STIMULATOR INSERTION: SHX5378

## 2018-05-10 SURGERY — INSERTION, SPINAL CORD STIMULATOR, LUMBAR
Anesthesia: General

## 2018-05-10 MED ORDER — PROPOFOL 10 MG/ML IV BOLUS
INTRAVENOUS | Status: DC | PRN
  Administered 2018-05-10 (×4): 10 mg via INTRAVENOUS

## 2018-05-10 MED ORDER — PROPOFOL 500 MG/50ML IV EMUL
INTRAVENOUS | Status: DC | PRN
  Administered 2018-05-10: 75 ug/kg/min via INTRAVENOUS
  Administered 2018-05-10 (×2): via INTRAVENOUS

## 2018-05-10 MED ORDER — BACITRACIN-NEOMYCIN-POLYMYXIN 400-5-5000 EX OINT
TOPICAL_OINTMENT | CUTANEOUS | Status: AC
  Filled 2018-05-10: qty 1

## 2018-05-10 MED ORDER — BUPIVACAINE-EPINEPHRINE (PF) 0.5% -1:200000 IJ SOLN
INTRAMUSCULAR | Status: AC
  Filled 2018-05-10: qty 60

## 2018-05-10 MED ORDER — DEXAMETHASONE SODIUM PHOSPHATE 10 MG/ML IJ SOLN
INTRAMUSCULAR | Status: DC | PRN
  Administered 2018-05-10: 4 mg via INTRAVENOUS

## 2018-05-10 MED ORDER — MIDAZOLAM HCL 2 MG/2ML IJ SOLN
INTRAMUSCULAR | Status: AC
  Filled 2018-05-10: qty 2

## 2018-05-10 MED ORDER — BUTALBITAL-APAP-CAFFEINE 50-325-40 MG PO TABS
2.0000 | ORAL_TABLET | Freq: Once | ORAL | Status: AC
  Administered 2018-05-10: 2 via ORAL
  Filled 2018-05-10: qty 2

## 2018-05-10 MED ORDER — FENTANYL CITRATE (PF) 250 MCG/5ML IJ SOLN
INTRAMUSCULAR | Status: AC
  Filled 2018-05-10: qty 5

## 2018-05-10 MED ORDER — PROPOFOL 10 MG/ML IV BOLUS
INTRAVENOUS | Status: AC
  Filled 2018-05-10: qty 20

## 2018-05-10 MED ORDER — 0.9 % SODIUM CHLORIDE (POUR BTL) OPTIME
TOPICAL | Status: DC | PRN
  Administered 2018-05-10: 1000 mL

## 2018-05-10 MED ORDER — PROMETHAZINE HCL 25 MG/ML IJ SOLN: 6.2500 mg | INTRAMUSCULAR | Status: DC | PRN

## 2018-05-10 MED ORDER — FENTANYL CITRATE (PF) 250 MCG/5ML IJ SOLN
INTRAMUSCULAR | Status: DC | PRN
  Administered 2018-05-10 (×7): 25 ug via INTRAVENOUS

## 2018-05-10 MED ORDER — HYDROCODONE-ACETAMINOPHEN 5-325 MG PO TABS: 1.0000 | ORAL_TABLET | ORAL | 0 refills | Status: DC | PRN

## 2018-05-10 MED ORDER — LACTATED RINGERS IV SOLN
INTRAVENOUS | Status: DC
  Administered 2018-05-10 (×2): via INTRAVENOUS

## 2018-05-10 MED ORDER — ONDANSETRON HCL 4 MG/2ML IJ SOLN
INTRAMUSCULAR | Status: DC | PRN
  Administered 2018-05-10: 4 mg via INTRAVENOUS

## 2018-05-10 MED ORDER — SODIUM CHLORIDE 0.9 % IV SOLN
INTRAVENOUS | Status: DC | PRN
  Administered 2018-05-10: 08:00:00

## 2018-05-10 MED ORDER — BUPIVACAINE-EPINEPHRINE (PF) 0.5% -1:200000 IJ SOLN
INTRAMUSCULAR | Status: DC | PRN
  Administered 2018-05-10: 24 mL

## 2018-05-10 MED ORDER — MIDAZOLAM HCL 5 MG/5ML IJ SOLN
INTRAMUSCULAR | Status: DC | PRN
  Administered 2018-05-10 (×2): 1 mg via INTRAVENOUS

## 2018-05-10 MED ORDER — CLINDAMYCIN HCL 150 MG PO CAPS: 150.0000 mg | ORAL_CAPSULE | Freq: Three times a day (TID) | ORAL | 0 refills | Status: AC

## 2018-05-10 MED ORDER — BUPIVACAINE-EPINEPHRINE (PF) 0.25% -1:200000 IJ SOLN
INTRAMUSCULAR | Status: AC
  Filled 2018-05-10: qty 30

## 2018-05-10 MED ORDER — HYDROMORPHONE HCL 1 MG/ML IJ SOLN: 0.2500 mg | INTRAMUSCULAR | Status: DC | PRN

## 2018-05-10 SURGICAL SUPPLY — 60 items
BAG DECANTER FOR FLEXI CONT (MISCELLANEOUS) ×2 IMPLANT
BENZOIN TINCTURE PRP APPL 2/3 (GAUZE/BANDAGES/DRESSINGS) IMPLANT
BINDER ABDOMINAL 12 ML 46-62 (SOFTGOODS) ×2 IMPLANT
BLADE CLIPPER SURG (BLADE) IMPLANT
CHLORAPREP W/TINT 26ML (MISCELLANEOUS) ×2 IMPLANT
CLIP VESOCCLUDE SM WIDE 6/CT (CLIP) IMPLANT
DERMABOND ADVANCED (GAUZE/BANDAGES/DRESSINGS) ×1
DERMABOND ADVANCED .7 DNX12 (GAUZE/BANDAGES/DRESSINGS) ×1 IMPLANT
DEVICE THERAPY PROCLAM DRG IPG (Neuro Prosthesis/Implant) ×1 IMPLANT
DRAPE C-ARM 42X72 X-RAY (DRAPES) ×2 IMPLANT
DRAPE C-ARMOR (DRAPES) ×2 IMPLANT
DRAPE LAPAROTOMY 100X72X124 (DRAPES) ×2 IMPLANT
DRAPE POUCH INSTRU U-SHP 10X18 (DRAPES) ×2 IMPLANT
DRAPE SURG 17X23 STRL (DRAPES) ×2 IMPLANT
DRSG OPSITE POSTOP 3X4 (GAUZE/BANDAGES/DRESSINGS) ×2 IMPLANT
DRSG TEGADERM 2-3/8X2-3/4 SM (GAUZE/BANDAGES/DRESSINGS) ×2 IMPLANT
ELECT REM PT RETURN 9FT ADLT (ELECTROSURGICAL) ×2
ELECTRODE REM PT RTRN 9FT ADLT (ELECTROSURGICAL) ×1 IMPLANT
FEE INTRAOP MONITOR IMPULS NCS (MISCELLANEOUS) ×1 IMPLANT
GAUZE 4X4 16PLY RFD (DISPOSABLE) ×2 IMPLANT
GAUZE SPONGE 4X4 16PLY XRAY LF (GAUZE/BANDAGES/DRESSINGS) ×2 IMPLANT
GLOVE BIOGEL PI IND STRL 7.5 (GLOVE) ×1 IMPLANT
GLOVE BIOGEL PI INDICATOR 7.5 (GLOVE) ×1
GLOVE ECLIPSE 7.5 STRL STRAW (GLOVE) ×2 IMPLANT
GLOVE EXAM NITRILE LRG STRL (GLOVE) IMPLANT
GLOVE EXAM NITRILE XL STR (GLOVE) IMPLANT
GLOVE EXAM NITRILE XS STR PU (GLOVE) IMPLANT
GOWN STRL REUS W/ TWL LRG LVL3 (GOWN DISPOSABLE) IMPLANT
GOWN STRL REUS W/ TWL XL LVL3 (GOWN DISPOSABLE) IMPLANT
GOWN STRL REUS W/TWL 2XL LVL3 (GOWN DISPOSABLE) IMPLANT
GOWN STRL REUS W/TWL LRG LVL3 (GOWN DISPOSABLE)
GOWN STRL REUS W/TWL XL LVL3 (GOWN DISPOSABLE)
INTRAOP MONITOR FEE IMPULS NCS (MISCELLANEOUS) ×1
INTRAOP MONITOR FEE IMPULSE (MISCELLANEOUS) ×1
KIT BASIN OR (CUSTOM PROCEDURE TRAY) ×2 IMPLANT
KIT TURNOVER KIT B (KITS) ×2 IMPLANT
LEAD AXIUM 50CM IMPLANT SLIM (Lead) ×4 IMPLANT
NEEDLE 18GX1X1/2 (RX/OR ONLY) (NEEDLE) IMPLANT
NEEDLE HYPO 25X1 1.5 SAFETY (NEEDLE) ×2 IMPLANT
NS IRRIG 1000ML POUR BTL (IV SOLUTION) ×2 IMPLANT
PACK LAMINECTOMY NEURO (CUSTOM PROCEDURE TRAY) ×2 IMPLANT
PAD ARMBOARD 7.5X6 YLW CONV (MISCELLANEOUS) ×2 IMPLANT
PROCLAIM DRG IPG (Neuro Prosthesis/Implant) ×2 IMPLANT
PROCLAIM PROGRAMMER PATIENT (MISCELLANEOUS) ×2 IMPLANT
SPONGE LAP 4X18 RFD (DISPOSABLE) ×2 IMPLANT
SPONGE SURGIFOAM ABS GEL SZ50 (HEMOSTASIS) IMPLANT
STAPLER SKIN PROX WIDE 3.9 (STAPLE) ×2 IMPLANT
STRIP CLOSURE SKIN 1/2X4 (GAUZE/BANDAGES/DRESSINGS) IMPLANT
SUT MNCRL AB 4-0 PS2 18 (SUTURE) IMPLANT
SUT SILK 0 (SUTURE) ×1
SUT SILK 0 MO-6 18XCR BRD 8 (SUTURE) ×1 IMPLANT
SUT SILK 0 TIES 10X30 (SUTURE) IMPLANT
SUT SILK 2 0 TIES 10X30 (SUTURE) IMPLANT
SUT VIC AB 2-0 CP2 18 (SUTURE) ×4 IMPLANT
SYR 10ML LL (SYRINGE) IMPLANT
SYR EPIDURAL 5ML GLASS (SYRINGE) ×2 IMPLANT
TOWEL GREEN STERILE (TOWEL DISPOSABLE) ×2 IMPLANT
TOWEL GREEN STERILE FF (TOWEL DISPOSABLE) ×2 IMPLANT
WATER STERILE IRR 1000ML POUR (IV SOLUTION) ×2 IMPLANT
YANKAUER SUCT BULB TIP NO VENT (SUCTIONS) ×2 IMPLANT

## 2018-05-10 NOTE — Progress Notes (Deleted)
Restless/flailing and shaking head

## 2018-05-10 NOTE — Op Note (Signed)
1.CRPS of the right LE    Post-op. Diagnosis:    Same as preop    Operation:    1. Spinal stimulator with leads placed at S1 on the right,L5on the right.2. Placement of IPG (batttery)    Anesthesia:  Local with MAC; SSEP/EMG monitoring    Indications:  Chronic pain, CRPS    Details of Procedure:  An informed and written consent was reviewed with the patient prior to proceeding with an outpatient placement of a spinal stimulator trial with 2 leads--right L5 and right S1-- with the risks of this procedure fully disclosed including but not limited to the risk of infection, bleeding, increased pain, dural puncture, postdural puncture headache and neurological sequel, stroke and death. The patient verbalized understanding of the described procedure and informed written consent was signed and placed in the patient's permanent medical record.   The patient was transported to the operating room and positioned in the prone position. All pressure points were checked and padded prior to the procedure. Appropriate monitors were placed to monitor SSEP and EMG from the right lower extremity in order to verify that there was no irritation/injury/overstimulation of the DRG in question.  Anesthesia was provided with monitored anesthesia care. The area overlying thelumbar spine, sacrum, and buttockswas aseptically prepared in a normal sterile fashion and draped. Local anesthesia of the skin was obtained and anesthesia of the subcutaneous tissue was obtained.   A 15-gauge Tuohy needle which was advanced into the neural foramina as follows. The spinal stimulator leads were threaded through separate introducer needles into the epidural space and placed appropriately on the right at S1, and right L5 levels. At this point intraoperative trialing of the spinal stimulator leads was performed and was determined to be effectively covering the areas targeted.  A subcutaneous pocket for an IPG (battery)  was created at the right flank using sharp and blunt dissection. Leads were tunneled to the battery using a reverse Seldinger technique, and connected appropriately. Testing indicated good impedances.   The lead placement sites were closed with subcuticular 2-0 vicryl sutures and staples.  The IPG site was closed with a deeper layer of interrupted 2-0 vicryl sutures, and the skin closed with a staples.  Final fluoroscopic images were retained for the patient's permanent medical record demonstrating placement of spinal stimulator leads within the epidural space on the right at L5, right at S1 levels. AP and lateral views were obtained of the leads for the patient's permanent medical record.   The patient was then transported to the Recovery Room in stable condition with an intact neurological evaluation. After adequately recovering from the monitored anesthesia care, the patient spinal stimulator was programmed and optimized to best cover the areas of pain in the dermatomal distributions of the pain described preoperatively. The patient was then discharged to home with a followup visit for incision/wound check in 14 days .    Specimens:  None    Complications:  None    Findings:  AP and lateral views of this patient's lumbar spine demonstrate placement of  2 leads were utilized because the leads are specifically placed to directly apply energy to the cell bodies within the dorsal root ganglion (DRG). This direct application of energy to the dorsal root ganglion (DRG) at specific levels effectively treats the patient's specific and focal pain associated with dermatome distribution.

## 2018-05-10 NOTE — Anesthesia Procedure Notes (Signed)
Procedure Name: MAC Date/Time: 05/10/2018 7:29 AM Performed by: Wilburn Cornelia, CRNA Pre-anesthesia Checklist: Patient identified, Emergency Drugs available, Suction available, Patient being monitored and Timeout performed Patient Re-evaluated:Patient Re-evaluated prior to induction Oxygen Delivery Method: Simple face mask Placement Confirmation: positive ETCO2 and breath sounds checked- equal and bilateral Dental Injury: Teeth and Oropharynx as per pre-operative assessment

## 2018-05-10 NOTE — Progress Notes (Signed)
Stimulator rep at bedside , adjustung settings according to pt's responses

## 2018-05-10 NOTE — Transfer of Care (Signed)
Immediate Anesthesia Transfer of Care Note  Patient: Colleen Hoffman  Procedure(s) Performed: Dorsal root ganglion stimulator placement (N/A )  Patient Location: PACU  Anesthesia Type:General  Level of Consciousness: awake and alert   Airway & Oxygen Therapy: Patient Spontanous Breathing and Patient connected to face mask oxygen  Post-op Assessment: Report given to RN and Post -op Vital signs reviewed and stable  Post vital signs: Reviewed and stable  Last Vitals:  Vitals Value Taken Time  BP 106/51 05/10/2018 10:47 AM  Temp    Pulse 81 05/10/2018 10:47 AM  Resp 13 05/10/2018 10:47 AM  SpO2 97 % 05/10/2018 10:47 AM  Vitals shown include unvalidated device data.  Last Pain:  Vitals:   05/10/18 0609  TempSrc:   PainSc: 7          Complications: No apparent anesthesia complications

## 2018-05-10 NOTE — Discharge Instructions (Signed)
Dr. Kaizen Ibsen Post-Op Orders ° °• Ice Pack - 20 minutes on (in a pillow case), and 20 minutes off. Wear the ice pack UNDER the binder. °• Follow up in office, they will call you for an appointment in 10 days to 2 weeks. °• Increase activity gradually.   °• No lifting anything heavier than a gallon of milk (10 pounds) until seen in the office. °• Advance diet slowly as tolerated. °• Dressing care:  Keep dressing dry for 3 days, and on Post-op day 4, may shower. °• Call for fever, drainage, and redness. °• No swimming or bathing in a bathtub (do not get into standing water). °•  °

## 2018-05-13 ENCOUNTER — Other Ambulatory Visit: Payer: Self-pay

## 2018-05-13 ENCOUNTER — Encounter (HOSPITAL_COMMUNITY): Payer: Self-pay | Admitting: *Deleted

## 2018-05-13 ENCOUNTER — Emergency Department (HOSPITAL_COMMUNITY)
Admission: EM | Admit: 2018-05-13 | Discharge: 2018-05-13 | Disposition: A | Discharge status: Home / Self Care | Payer: Commercial Managed Care - PPO | Attending: Emergency Medicine | Admitting: Emergency Medicine

## 2018-05-13 DIAGNOSIS — R203 Hyperesthesia: Secondary | ICD-10-CM | POA: Diagnosis not present

## 2018-05-13 DIAGNOSIS — R52 Pain, unspecified: Secondary | ICD-10-CM | POA: Diagnosis present

## 2018-05-13 DIAGNOSIS — G43909 Migraine, unspecified, not intractable, without status migrainosus: Secondary | ICD-10-CM | POA: Diagnosis not present

## 2018-05-13 LAB — BASIC METABOLIC PANEL
Anion gap: 7 (ref 5–15)
BUN: 7 mg/dL (ref 6–20)
CALCIUM: 8.7 mg/dL — AB (ref 8.9–10.3)
CO2: 25 mmol/L (ref 22–32)
Chloride: 108 mmol/L (ref 98–111)
Creatinine, Ser: 0.55 mg/dL (ref 0.44–1.00)
Glucose, Bld: 114 mg/dL — ABNORMAL HIGH (ref 70–99)
Potassium: 3.7 mmol/L (ref 3.5–5.1)
Sodium: 140 mmol/L (ref 135–145)

## 2018-05-13 LAB — CBC WITH DIFFERENTIAL/PLATELET
BASOS ABS: 0 10*3/uL (ref 0.0–0.1)
BASOS PCT: 0 %
Eosinophils Absolute: 0.2 10*3/uL (ref 0.0–0.7)
Eosinophils Relative: 2 %
HEMATOCRIT: 39.7 % (ref 36.0–46.0)
Hemoglobin: 13.5 g/dL (ref 12.0–15.0)
Lymphocytes Relative: 16 %
Lymphs Abs: 1.8 10*3/uL (ref 0.7–4.0)
MCH: 33 pg (ref 26.0–34.0)
MCHC: 34 g/dL (ref 30.0–36.0)
MCV: 97.1 fL (ref 78.0–100.0)
MONO ABS: 0.7 10*3/uL (ref 0.1–1.0)
Monocytes Relative: 6 %
NEUTROS ABS: 8.5 10*3/uL — AB (ref 1.7–7.7)
Neutrophils Relative %: 76 %
PLATELETS: 327 10*3/uL (ref 150–400)
RBC: 4.09 MIL/uL (ref 3.87–5.11)
RDW: 13.6 % (ref 11.5–15.5)
WBC: 11.2 10*3/uL — AB (ref 4.0–10.5)

## 2018-05-13 MED ORDER — KETOROLAC TROMETHAMINE 30 MG/ML IJ SOLN
30.0000 mg | Freq: Once | INTRAMUSCULAR | Status: AC
  Administered 2018-05-13: 30 mg via INTRAVENOUS
  Filled 2018-05-13: qty 1

## 2018-05-13 MED ORDER — SODIUM CHLORIDE 0.9 % IV BOLUS
1000.0000 mL | Freq: Once | INTRAVENOUS | Status: AC
  Administered 2018-05-13: 1000 mL via INTRAVENOUS

## 2018-05-13 MED ORDER — DIPHENHYDRAMINE HCL 50 MG/ML IJ SOLN
25.0000 mg | Freq: Once | INTRAMUSCULAR | Status: AC
  Administered 2018-05-13: 25 mg via INTRAVENOUS
  Filled 2018-05-13: qty 1

## 2018-05-13 MED ORDER — PROCHLORPERAZINE EDISYLATE 10 MG/2ML IJ SOLN
10.0000 mg | Freq: Once | INTRAMUSCULAR | Status: AC
  Administered 2018-05-13: 10 mg via INTRAVENOUS
  Filled 2018-05-13: qty 2

## 2018-05-13 MED ORDER — HYDROMORPHONE HCL 1 MG/ML IJ SOLN
1.0000 mg | Freq: Once | INTRAMUSCULAR | Status: AC
  Administered 2018-05-13: 1 mg via INTRAVENOUS
  Filled 2018-05-13: qty 1

## 2018-05-13 NOTE — ED Notes (Signed)
Patient has been unable to keep down her prescribed pain meds and does have a history of migraines. Pt also has a history of CRPS which is why the internal tens unit was placed. Patient complaining of 10/10 pain.

## 2018-05-13 NOTE — ED Triage Notes (Signed)
Pt present in w/c to triage with her family. She c/o migraine headache, vomiting and diarrhea for 6 hours tonight. Pt had spinal cord stimulator placed on 05/10/18. She is lying on her left side in triage on the floor, refusing to get in triage chair or in w/c at this time until we get her a bed.
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
## 2018-05-14 ENCOUNTER — Encounter (HOSPITAL_COMMUNITY): Payer: Self-pay | Admitting: Anesthesiology

## 2018-05-16 NOTE — Anesthesia Postprocedure Evaluation (Signed)
Anesthesia Post Note  Patient: Colleen Hoffman  Procedure(s) Performed: Dorsal root ganglion stimulator placement (N/A )     Patient location during evaluation: PACU Anesthesia Type: MAC Level of consciousness: awake and alert Pain management: pain level controlled Vital Signs Assessment: post-procedure vital signs reviewed and stable Respiratory status: spontaneous breathing, nonlabored ventilation, respiratory function stable and patient connected to nasal cannula oxygen Cardiovascular status: stable and blood pressure returned to baseline Postop Assessment: no apparent nausea or vomiting Anesthetic complications: no    Last Vitals:  Vitals:   05/10/18 1145 05/10/18 1200  BP:  (!) 91/55  Pulse: 76 71  Resp: 14 17  Temp:  (!) 36.3 C  SpO2: 97% 96%    Last Pain:  Vitals:   05/10/18 1230  TempSrc:   PainSc: 0-No pain                 Ryan P Ellender

## 2018-05-23 ENCOUNTER — Other Ambulatory Visit: Payer: Self-pay

## 2018-05-23 ENCOUNTER — Emergency Department (HOSPITAL_COMMUNITY): Payer: Commercial Managed Care - PPO

## 2018-05-23 ENCOUNTER — Encounter (HOSPITAL_COMMUNITY): Payer: Self-pay

## 2018-05-23 ENCOUNTER — Emergency Department (HOSPITAL_COMMUNITY)
Admission: EM | Admit: 2018-05-23 | Discharge: 2018-05-24 | Disposition: A | Discharge status: Home / Self Care | Payer: Commercial Managed Care - PPO | Attending: Emergency Medicine | Admitting: Emergency Medicine

## 2018-05-23 DIAGNOSIS — R51 Headache: Secondary | ICD-10-CM | POA: Insufficient documentation

## 2018-05-23 DIAGNOSIS — R519 Headache, unspecified: Secondary | ICD-10-CM

## 2018-05-23 DIAGNOSIS — M542 Cervicalgia: Secondary | ICD-10-CM | POA: Diagnosis not present

## 2018-05-23 LAB — URINALYSIS, ROUTINE W REFLEX MICROSCOPIC
BILIRUBIN URINE: NEGATIVE
Glucose, UA: NEGATIVE mg/dL
Hgb urine dipstick: NEGATIVE
KETONES UR: NEGATIVE mg/dL
LEUKOCYTES UA: NEGATIVE
Nitrite: NEGATIVE
Protein, ur: NEGATIVE mg/dL
SPECIFIC GRAVITY, URINE: 1.008 (ref 1.005–1.030)
pH: 8 (ref 5.0–8.0)

## 2018-05-23 LAB — CBC WITH DIFFERENTIAL/PLATELET
Abs Immature Granulocytes: 0 10*3/uL (ref 0.0–0.1)
BASOS PCT: 0 %
Basophils Absolute: 0 10*3/uL (ref 0.0–0.1)
Eosinophils Absolute: 0.1 10*3/uL (ref 0.0–0.7)
Eosinophils Relative: 1 %
HCT: 43 % (ref 36.0–46.0)
Hemoglobin: 13.9 g/dL (ref 12.0–15.0)
IMMATURE GRANULOCYTES: 0 %
Lymphocytes Relative: 22 %
Lymphs Abs: 1.8 10*3/uL (ref 0.7–4.0)
MCH: 32.1 pg (ref 26.0–34.0)
MCHC: 32.3 g/dL (ref 30.0–36.0)
MCV: 99.3 fL (ref 78.0–100.0)
Monocytes Absolute: 0.4 10*3/uL (ref 0.1–1.0)
Monocytes Relative: 5 %
NEUTROS PCT: 72 %
Neutro Abs: 5.7 10*3/uL (ref 1.7–7.7)
PLATELETS: 419 10*3/uL — AB (ref 150–400)
RBC: 4.33 MIL/uL (ref 3.87–5.11)
RDW: 13 % (ref 11.5–15.5)
WBC: 8 10*3/uL (ref 4.0–10.5)

## 2018-05-23 LAB — BASIC METABOLIC PANEL
ANION GAP: 8 (ref 5–15)
BUN: 8 mg/dL (ref 6–20)
CALCIUM: 8.9 mg/dL (ref 8.9–10.3)
CO2: 22 mmol/L (ref 22–32)
Chloride: 110 mmol/L (ref 98–111)
Creatinine, Ser: 0.53 mg/dL (ref 0.44–1.00)
Glucose, Bld: 91 mg/dL (ref 70–99)
Potassium: 3.6 mmol/L (ref 3.5–5.1)
SODIUM: 140 mmol/L (ref 135–145)

## 2018-05-23 MED ORDER — METOCLOPRAMIDE HCL 5 MG/ML IJ SOLN
10.0000 mg | Freq: Once | INTRAMUSCULAR | Status: AC
  Administered 2018-05-23: 10 mg via INTRAMUSCULAR
  Filled 2018-05-23: qty 2

## 2018-05-23 MED ORDER — HYDROCODONE-ACETAMINOPHEN 5-325 MG PO TABS: 1.0000 | ORAL_TABLET | ORAL | 0 refills | Status: DC | PRN

## 2018-05-23 MED ORDER — DIPHENHYDRAMINE HCL 50 MG/ML IJ SOLN: 25.0000 mg | Freq: Once | INTRAMUSCULAR | Status: DC

## 2018-05-23 MED ORDER — FENTANYL CITRATE (PF) 100 MCG/2ML IJ SOLN
100.0000 ug | Freq: Once | INTRAMUSCULAR | Status: AC
  Administered 2018-05-23: 100 ug via INTRAMUSCULAR
  Filled 2018-05-23: qty 2

## 2018-05-23 MED ORDER — SODIUM CHLORIDE 0.9 % IV BOLUS
1000.0000 mL | Freq: Once | INTRAVENOUS | Status: AC
  Administered 2018-05-23: 1000 mL via INTRAVENOUS

## 2018-05-23 MED ORDER — PROCHLORPERAZINE EDISYLATE 10 MG/2ML IJ SOLN: 10.0000 mg | Freq: Once | INTRAMUSCULAR | Status: DC

## 2018-05-23 MED ORDER — DIPHENHYDRAMINE HCL 50 MG/ML IJ SOLN
25.0000 mg | Freq: Once | INTRAMUSCULAR | Status: AC
  Administered 2018-05-23: 25 mg via INTRAMUSCULAR
  Filled 2018-05-23: qty 1

## 2018-05-23 NOTE — ED Provider Notes (Signed)
Patient placed in Quick Look pathway, seen and evaluated   Chief Complaint: HA, and back pain  HPI: 42 y.o. female with a history of complex regional pain syndrome secondary to a fracture of her right leg several years ago who recently had a TENS unit placed in her lumbar spine on the 14th who is been dealing with increasing back pain as well as generalized chronic pain.  She reports that she has been having on and off migraines.  She was seen here on the 16th for the same.  She is follow-up with a neurologist since that time and had a "blood patch" done per her husband.  Patient reports that today however at 1230 p.m. she felt a HA that felt like she was "shot by a bullet". She notes that it is generalized. No new visual changes, vertigo, diplopia, facial droop, difficulty with speech, numbness/tingling/weakness, bowel/bladder incontinence, urinary retention, saddle anesthesia, abdominal pain, nausea/vomiting/diarrhea, photophobia, phonophobia, or fever.  ROS: HA, back pain  Physical Exam:   Gen: No distress. Lying on bed flat (says this helps)  Neuro: Awake and Alert  Skin: Warm    Focused Exam: Staples of the low back from recent implantation without surrounding signs of infection. Diffuse tenderness of the spine without point tenderness of step offs noted.  Speech clear. Follows commands. No facial droop. PERRLA. EOM grossly intact. CN III-XII grossly intact. Grossly moves all extremities 4 without ataxia. Able and appropriate strength for age to upper and lower extremities bilaterally including grip strength and extensor hallicus longus   Initiation of care has begun. The patient has been counseled on the process, plan, and necessity for staying for the completion/evaluation, and the remainder of the medical screening examination    Princella Pellegrini 05/23/18 Carroll Sage, MD 05/23/18 2328

## 2018-05-23 NOTE — Discharge Instructions (Signed)
Please read and follow all provided instructions.  Your diagnoses today include:  1. Bad headache     Tests performed today include:  CT of your head which was normal and did not show any serious cause of your headache  Vital signs. See below for your results today.   Medications:  In the Emergency Department you received:  Reglan - antinausea/headache medication  Benadryl - antihistamine to counteract potential side effects of reglan   Vicodin (hydrocodone/acetaminophen) - narcotic pain medication  DO NOT drive or perform any activities that require you to be awake and alert because this medicine can make you drowsy. BE VERY CAREFUL not to take multiple medicines containing Tylenol (also called acetaminophen). Doing so can lead to an overdose which can damage your liver and cause liver failure and possibly death.  Take any prescribed medications only as directed.  Additional information:  Follow any educational materials contained in this packet.  You are having a headache. No specific cause was found today for your headache. It may have been a migraine or other cause of headache. Stress, anxiety, fatigue, and depression are common triggers for headaches.   Your headache today does not appear to be life-threatening or require hospitalization, but often the exact cause of headaches is not determined in the emergency department. Therefore, follow-up with your doctor is very important to find out what may have caused your headache and whether or not you need any further diagnostic testing or treatment.   Sometimes headaches can appear benign (not harmful), but then more serious symptoms can develop which should prompt an immediate re-evaluation by your doctor or the emergency department.  BE VERY CAREFUL not to take multiple medicines containing Tylenol (also called acetaminophen). Doing so can lead to an overdose which can damage your liver and cause liver failure and possibly  death.   Follow-up instructions: Please follow-up with your primary care provider in the next 3 days for further evaluation of your symptoms.   Return instructions:   Please return to the Emergency Department if you experience worsening symptoms.  Return if the medications do not resolve your headache, if it recurs, or if you have multiple episodes of vomiting or cannot keep down fluids.  Return if you have a change from the usual headache.  RETURN IMMEDIATELY IF you:  Develop a sudden, severe headache  Develop confusion or become poorly responsive or faint  Develop a fever above 100.54F or problem breathing  Have a change in speech, vision, swallowing, or understanding  Develop new weakness, numbness, tingling, incoordination in your arms or legs  Have a seizure  Please return if you have any other emergent concerns.  Additional Information:  Your vital signs today were: BP 127/79 (BP Location: Left Arm)    Pulse 84    Temp 98.4 F (36.9 C) (Oral)    Resp 17    Ht 5\' 6"  (1.676 m)    Wt 101.6 kg    SpO2 100%    BMI 36.15 kg/m  If your blood pressure (BP) was elevated above 135/85 this visit, please have this repeated by your doctor within one month. --------------
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
## 2018-05-23 NOTE — ED Triage Notes (Signed)
Pt husband at bedside reports that pt has a spinal cord stimulator that has been placed, reports she has had spinal fluid leaking from area, had blood patch on the 17th to plug the hole, went back for post op 2 days ago, today they think that the blood patch popped, no exterior  leakage.   Upon entering room pt laying on triage floor, did not fall reports she is only comfortable when lying flat.

## 2018-05-24 ENCOUNTER — Other Ambulatory Visit: Payer: Self-pay | Admitting: Anesthesiology

## 2018-05-24 ENCOUNTER — Ambulatory Visit
Admission: RE | Admit: 2018-05-24 | Discharge: 2018-05-24 | Disposition: A | Discharge status: Home / Self Care | Payer: Commercial Managed Care - PPO | Source: Ambulatory Visit | Attending: Anesthesiology | Admitting: Anesthesiology

## 2018-05-24 DIAGNOSIS — G971 Other reaction to spinal and lumbar puncture: Secondary | ICD-10-CM

## 2018-05-24 MED ORDER — IOPAMIDOL (ISOVUE-M 200) INJECTION 41%
1.0000 mL | Freq: Once | INTRAMUSCULAR | Status: AC
  Administered 2018-05-24: 1 mL via EPIDURAL

## 2018-05-24 NOTE — Discharge Instructions (Signed)

## 2018-05-24 NOTE — Progress Notes (Signed)
Blood obtained from pt's R AC for blood patch. Pt tolerated procedure well. Site is unremarkable. 

## 2019-09-01 IMAGING — RF DG C-ARM 61-120 MIN
1 series · 1 of 1 positions shown · non-contrast
Comparison: 04/05/2018.

CLINICAL DATA: Dorsal ganglion root stimulator placement.

EXAM:
LUMBAR SPINE - 1 VIEW; DG C-ARM 61-120 MIN

[Series 1: run · 1 of 1 slices shown]
[im 1/1]
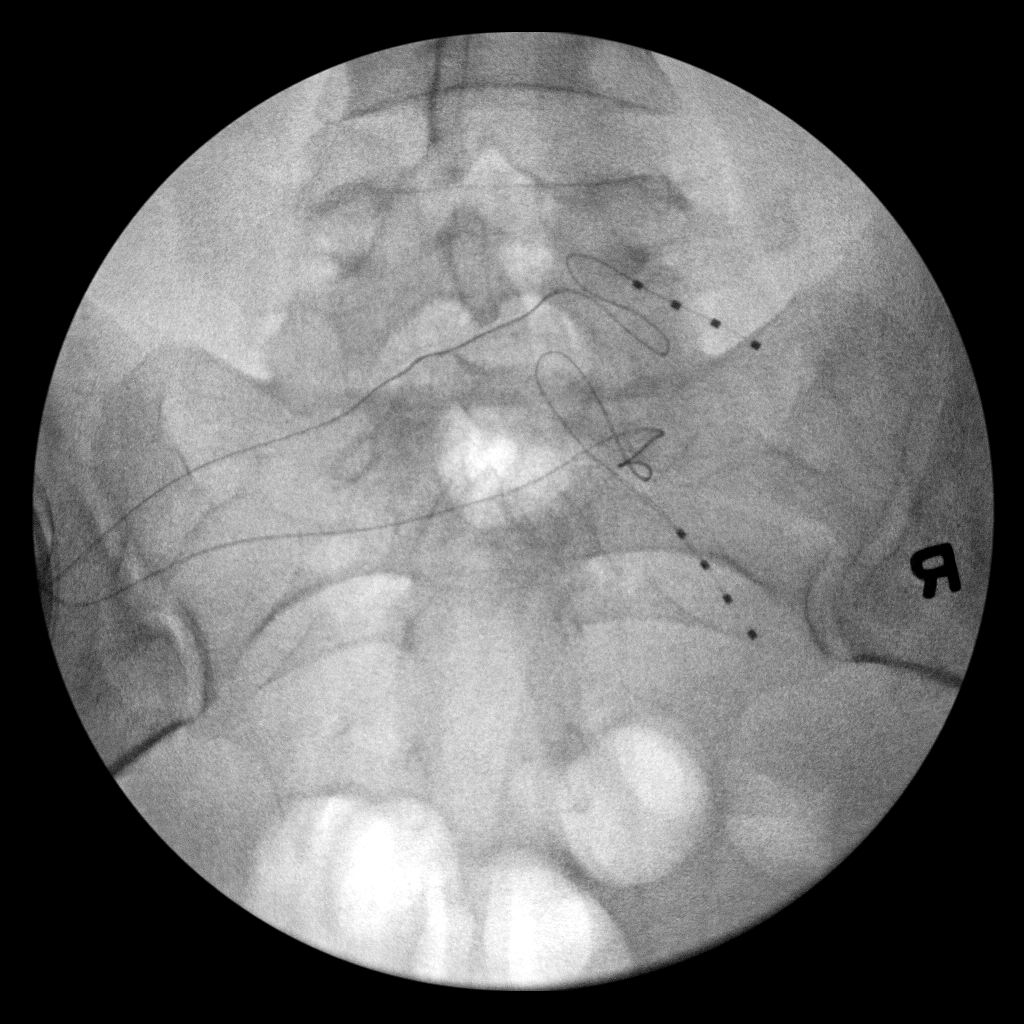

[1 of 1 positions shown; findings below may reference images not displayed]

FINDINGS: A single frontal C-arm view of the lumbosacral junction region
demonstrates 2 stimulator leads, 1 with its tip overlying the
superior aspect of the right sacral ala and the other with its tip
overlying the lateral aspect of the mid sacral ala on the right.
IMPRESSION: Two stimulator leads, as described above.

## 2019-09-15 IMAGING — XA Imaging study
1 series · 1 of 1 positions shown · non-contrast
Comparison: Intraop images 05/10/2018

CLINICAL DATA: Headache and "spinal migraine" after spinal cord
stimulator insertion. Relief after caudal epidural blood patch
administration, with sudden recurrence of symptoms after several
days. Repeat blood patch requested.

EXAM:
LUMBAR EPIDUROGRAM AND BLOOD PATCH
FLUOROSCOPY TIME:  99 seconds; 69 u9ymR DAP
TECHNIQUE: The procedure, risks (including but not limited to bleeding,
infection, organ damage ), benefits, and alternatives were explained
to the patient. Questions regarding the procedure were encouraged
and answered. The patient understands and consents to the procedure.
An interlaminar approach at L5-S1 from the right was selected after
review of the imaging and discussion with Awa. D-Fish and Juzaino.

[Series 3: ortho adipose · 1 of 1 slices shown]
[im 1/1]
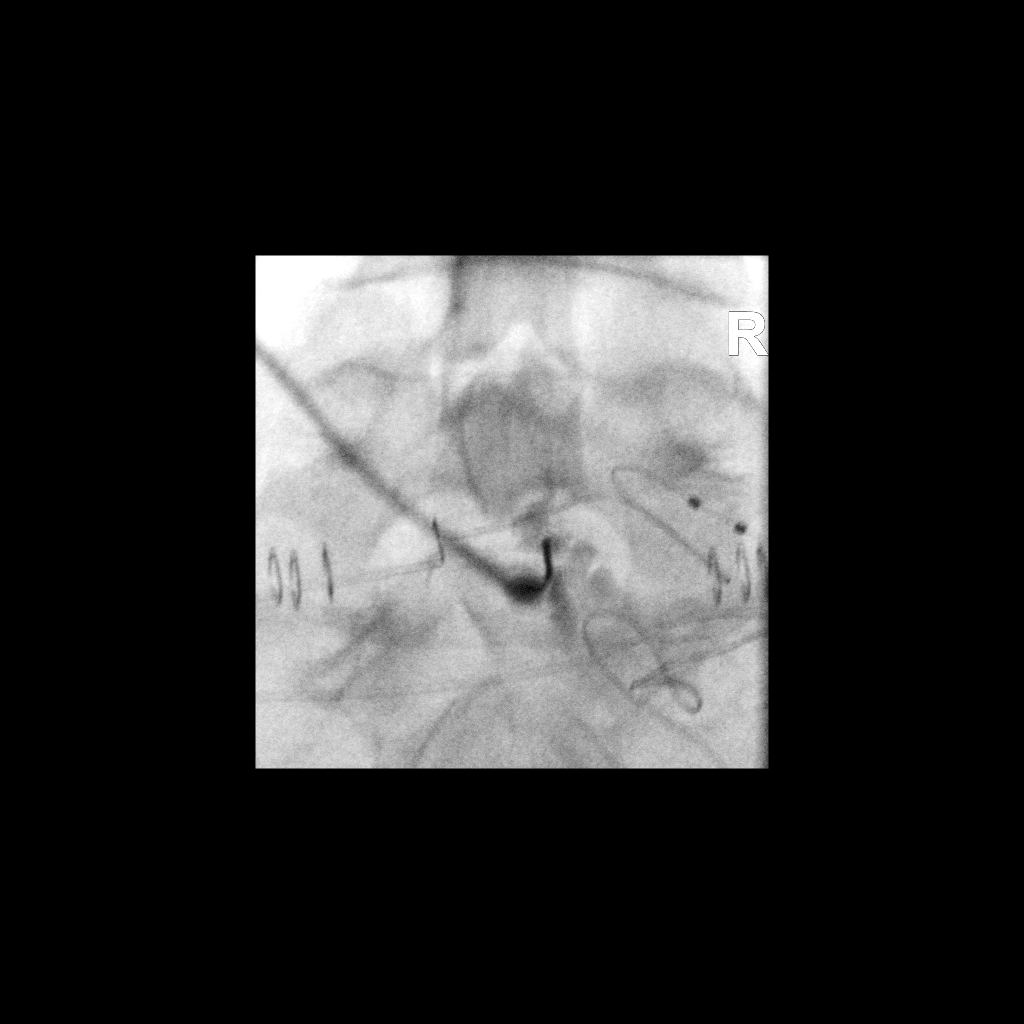

[1 of 1 positions shown; findings below may reference images not displayed]

The target site was localized under fluoroscopy. An appropriate skin
entry site was determined. Operator donned sterile gloves and mask.
Skin site was marked, prepped with Betadine, and draped in usual
sterile fashion, and infiltrated locally with 1% lidocaine. Under
continuous fluoroscopic visualization taking particular care to
avoid the radiodense catheter leads, the 20 gauge Crawford needle
was advanced. Using usual loss of resistance technique, the needle
was advanced just below the lamina, and contrast injection
demonstrated intrathecal spread. Using a more inferior approach, the
needle was placed into the lumbar posterior epidural space using a
right interlaminar approach at L3-3Lusing loss of resistance
technique. Diagnostic injection of 2ml Isovue-M 200 demonstrates
epidural spread above and below the needle tip and crossing the
midline. No intravascular component. 18 ml of autologous blood were
then administered as lumbar epidural blood patch. No immediate
complication.
IMPRESSION: 1. Technically successful lumbar epidural blood patch at L5-S1 on
the right, under fluoroscopy. The patient was counseled on the
importance of laying flat for an additional 24 hours and maintaining
good p.o. fluid intake.

## 2019-10-07 ENCOUNTER — Emergency Department (HOSPITAL_COMMUNITY): Payer: No Typology Code available for payment source

## 2019-10-07 ENCOUNTER — Other Ambulatory Visit: Payer: Self-pay

## 2019-10-07 ENCOUNTER — Inpatient Hospital Stay (HOSPITAL_COMMUNITY)
Admission: EM | Admit: 2019-10-07 | Discharge: 2019-10-10 | DRG: 419 | Disposition: A | Discharge status: Home / Self Care | Payer: No Typology Code available for payment source | Attending: Internal Medicine | Admitting: Internal Medicine

## 2019-10-07 ENCOUNTER — Encounter (HOSPITAL_COMMUNITY): Payer: Self-pay

## 2019-10-07 DIAGNOSIS — F419 Anxiety disorder, unspecified: Secondary | ICD-10-CM | POA: Diagnosis present

## 2019-10-07 DIAGNOSIS — K429 Umbilical hernia without obstruction or gangrene: Secondary | ICD-10-CM | POA: Diagnosis present

## 2019-10-07 DIAGNOSIS — Z90710 Acquired absence of both cervix and uterus: Secondary | ICD-10-CM

## 2019-10-07 DIAGNOSIS — Z8616 Personal history of COVID-19: Secondary | ICD-10-CM | POA: Diagnosis not present

## 2019-10-07 DIAGNOSIS — Z79899 Other long term (current) drug therapy: Secondary | ICD-10-CM

## 2019-10-07 DIAGNOSIS — Z419 Encounter for procedure for purposes other than remedying health state, unspecified: Secondary | ICD-10-CM

## 2019-10-07 DIAGNOSIS — G8929 Other chronic pain: Secondary | ICD-10-CM | POA: Diagnosis present

## 2019-10-07 DIAGNOSIS — G43909 Migraine, unspecified, not intractable, without status migrainosus: Secondary | ICD-10-CM | POA: Diagnosis present

## 2019-10-07 DIAGNOSIS — K810 Acute cholecystitis: Secondary | ICD-10-CM

## 2019-10-07 DIAGNOSIS — Z880 Allergy status to penicillin: Secondary | ICD-10-CM

## 2019-10-07 DIAGNOSIS — Z91018 Allergy to other foods: Secondary | ICD-10-CM

## 2019-10-07 DIAGNOSIS — K8000 Calculus of gallbladder with acute cholecystitis without obstruction: Principal | ICD-10-CM | POA: Diagnosis present

## 2019-10-07 DIAGNOSIS — K819 Cholecystitis, unspecified: Secondary | ICD-10-CM | POA: Diagnosis present

## 2019-10-07 DIAGNOSIS — R197 Diarrhea, unspecified: Secondary | ICD-10-CM | POA: Diagnosis present

## 2019-10-07 DIAGNOSIS — F329 Major depressive disorder, single episode, unspecified: Secondary | ICD-10-CM | POA: Diagnosis present

## 2019-10-07 DIAGNOSIS — Z885 Allergy status to narcotic agent status: Secondary | ICD-10-CM

## 2019-10-07 DIAGNOSIS — R109 Unspecified abdominal pain: Secondary | ICD-10-CM

## 2019-10-07 DIAGNOSIS — M549 Dorsalgia, unspecified: Secondary | ICD-10-CM | POA: Diagnosis present

## 2019-10-07 DIAGNOSIS — R079 Chest pain, unspecified: Secondary | ICD-10-CM | POA: Diagnosis present

## 2019-10-07 LAB — CBC
HCT: 37 % (ref 36.0–46.0)
Hemoglobin: 12.2 g/dL (ref 12.0–15.0)
MCH: 32.8 pg (ref 26.0–34.0)
MCHC: 33 g/dL (ref 30.0–36.0)
MCV: 99.5 fL (ref 80.0–100.0)
Platelets: 307 10*3/uL (ref 150–400)
RBC: 3.72 MIL/uL — ABNORMAL LOW (ref 3.87–5.11)
RDW: 12.8 % (ref 11.5–15.5)
WBC: 9.2 10*3/uL (ref 4.0–10.5)
nRBC: 0 % (ref 0.0–0.2)

## 2019-10-07 LAB — BASIC METABOLIC PANEL
Anion gap: 7 (ref 5–15)
BUN: 15 mg/dL (ref 6–20)
CO2: 23 mmol/L (ref 22–32)
Calcium: 8.7 mg/dL — ABNORMAL LOW (ref 8.9–10.3)
Chloride: 109 mmol/L (ref 98–111)
Creatinine, Ser: 0.65 mg/dL (ref 0.44–1.00)
GFR calc Af Amer: >60 mL/min (ref >60)
GFR calc non Af Amer: >60 mL/min (ref >60)
Glucose, Bld: 113 mg/dL — ABNORMAL HIGH (ref 70–99)
Potassium: 4 mmol/L (ref 3.5–5.1)
Sodium: 139 mmol/L (ref 135–145)

## 2019-10-07 LAB — LIPASE, BLOOD: Lipase: 24 U/L (ref 11–51)

## 2019-10-07 LAB — HEPATIC FUNCTION PANEL
ALT: 20 U/L (ref 0–44)
AST: 21 U/L (ref 15–41)
Albumin: 3.5 g/dL (ref 3.5–5.0)
Alkaline Phosphatase: 57 U/L (ref 38–126)
Bilirubin, Direct: 0.3 mg/dL — ABNORMAL HIGH (ref 0.0–0.2)
Indirect Bilirubin: 0.8 mg/dL (ref 0.3–0.9)
Total Bilirubin: 1.1 mg/dL (ref 0.3–1.2)
Total Protein: 6.5 g/dL (ref 6.5–8.1)

## 2019-10-07 LAB — TROPONIN I (HIGH SENSITIVITY)
Troponin I (High Sensitivity): <2 ng/L (ref <18)
Troponin I (High Sensitivity): <2 ng/L (ref <18)

## 2019-10-07 LAB — I-STAT BETA HCG BLOOD, ED (MC, WL, AP ONLY): I-stat hCG, quantitative: <5 m[IU]/mL (ref <5)

## 2019-10-07 LAB — RESPIRATORY PANEL BY RT PCR (FLU A&B, COVID)
Influenza A by PCR: NEGATIVE
Influenza B by PCR: NEGATIVE
SARS Coronavirus 2 by RT PCR: POSITIVE — AB

## 2019-10-07 LAB — HIV ANTIBODY (ROUTINE TESTING W REFLEX): HIV Screen 4th Generation wRfx: NONREACTIVE

## 2019-10-07 MED ORDER — SODIUM CHLORIDE 0.9 % IV SOLN
80.0000 mg | Freq: Once | INTRAVENOUS | Status: AC
  Administered 2019-10-07: 06:00:00 80 mg via INTRAVENOUS
  Filled 2019-10-07: qty 80

## 2019-10-07 MED ORDER — ACETAMINOPHEN 325 MG PO TABS: 650.0000 mg | ORAL_TABLET | Freq: Four times a day (QID) | ORAL | Status: DC | PRN

## 2019-10-07 MED ORDER — KETOROLAC TROMETHAMINE 15 MG/ML IJ SOLN
15.0000 mg | Freq: Four times a day (QID) | INTRAMUSCULAR | Status: DC | PRN
  Administered 2019-10-09: 15 mg via INTRAVENOUS
  Filled 2019-10-07 (×2): qty 1

## 2019-10-07 MED ORDER — METHOCARBAMOL 500 MG PO TABS: 500.0000 mg | ORAL_TABLET | Freq: Four times a day (QID) | ORAL | Status: DC | PRN

## 2019-10-07 MED ORDER — HYDROMORPHONE HCL 1 MG/ML IJ SOLN
0.5000 mg | INTRAMUSCULAR | Status: DC | PRN
  Administered 2019-10-07: 0.5 mg via INTRAVENOUS
  Filled 2019-10-07: qty 0.5

## 2019-10-07 MED ORDER — LORAZEPAM 2 MG/ML IJ SOLN: 1.0000 mg | Freq: Four times a day (QID) | INTRAMUSCULAR | Status: DC | PRN

## 2019-10-07 MED ORDER — SODIUM CHLORIDE (PF) 0.9 % IJ SOLN
INTRAMUSCULAR | Status: AC
  Filled 2019-10-07: qty 50

## 2019-10-07 MED ORDER — DIAZEPAM 5 MG/ML IJ SOLN
2.5000 mg | Freq: Once | INTRAMUSCULAR | Status: AC
  Administered 2019-10-07: 06:00:00 2.5 mg via INTRAVENOUS
  Filled 2019-10-07: qty 2

## 2019-10-07 MED ORDER — ENOXAPARIN SODIUM 40 MG/0.4ML ~~LOC~~ SOLN
40.0000 mg | SUBCUTANEOUS | Status: DC
  Administered 2019-10-07 – 2019-10-09 (×2): 40 mg via SUBCUTANEOUS
  Filled 2019-10-07 (×3): qty 0.4

## 2019-10-07 MED ORDER — DIPHENHYDRAMINE HCL 25 MG PO CAPS: 25.0000 mg | ORAL_CAPSULE | Freq: Four times a day (QID) | ORAL | Status: DC | PRN

## 2019-10-07 MED ORDER — FENTANYL CITRATE (PF) 100 MCG/2ML IJ SOLN
25.0000 ug | INTRAMUSCULAR | Status: DC | PRN
  Administered 2019-10-07: 25 ug via INTRAVENOUS
  Filled 2019-10-07: qty 2

## 2019-10-07 MED ORDER — POLYETHYLENE GLYCOL 3350 17 G PO PACK: 17.0000 g | PACK | Freq: Every day | ORAL | Status: DC | PRN

## 2019-10-07 MED ORDER — ACETAMINOPHEN 650 MG RE SUPP: 650.0000 mg | Freq: Four times a day (QID) | RECTAL | Status: DC | PRN

## 2019-10-07 MED ORDER — SODIUM CHLORIDE 0.9 % IV SOLN: INTRAVENOUS | Status: DC

## 2019-10-07 MED ORDER — HYDROMORPHONE HCL 1 MG/ML IJ SOLN
1.0000 mg | Freq: Once | INTRAMUSCULAR | Status: AC
  Administered 2019-10-07: 1 mg via INTRAVENOUS
  Filled 2019-10-07: qty 1

## 2019-10-07 MED ORDER — SODIUM CHLORIDE 0.9% FLUSH
3.0000 mL | Freq: Once | INTRAVENOUS | Status: AC
  Administered 2019-10-07: 05:00:00 3 mL via INTRAVENOUS

## 2019-10-07 MED ORDER — FENTANYL CITRATE (PF) 100 MCG/2ML IJ SOLN
50.0000 ug | Freq: Once | INTRAMUSCULAR | Status: AC
  Administered 2019-10-07: 05:00:00 50 ug via INTRAVENOUS
  Filled 2019-10-07: qty 2

## 2019-10-07 MED ORDER — FAMOTIDINE IN NACL 20-0.9 MG/50ML-% IV SOLN
20.0000 mg | Freq: Once | INTRAVENOUS | Status: AC
  Administered 2019-10-07: 07:00:00 20 mg via INTRAVENOUS
  Filled 2019-10-07: qty 50

## 2019-10-07 MED ORDER — CIPROFLOXACIN IN D5W 400 MG/200ML IV SOLN
400.0000 mg | Freq: Two times a day (BID) | INTRAVENOUS | Status: DC
  Administered 2019-10-07 – 2019-10-10 (×6): 400 mg via INTRAVENOUS
  Filled 2019-10-07 (×7): qty 200

## 2019-10-07 MED ORDER — PANTOPRAZOLE SODIUM 40 MG IV SOLR: 40.0000 mg | Freq: Every day | INTRAVENOUS | Status: DC

## 2019-10-07 MED ORDER — ENOXAPARIN SODIUM 40 MG/0.4ML ~~LOC~~ SOLN
40.0000 mg | SUBCUTANEOUS | Status: DC
  Administered 2019-10-07: 14:00:00 40 mg via SUBCUTANEOUS
  Filled 2019-10-07: qty 0.4

## 2019-10-07 MED ORDER — SIMETHICONE 80 MG PO CHEW
40.0000 mg | CHEWABLE_TABLET | Freq: Four times a day (QID) | ORAL | Status: DC | PRN
  Filled 2019-10-07: qty 1

## 2019-10-07 MED ORDER — PREGABALIN 50 MG PO CAPS
100.0000 mg | ORAL_CAPSULE | Freq: Every day | ORAL | Status: DC | PRN
  Administered 2019-10-09: 100 mg via ORAL
  Filled 2019-10-07: qty 2

## 2019-10-07 MED ORDER — CIPROFLOXACIN IN D5W 400 MG/200ML IV SOLN
400.0000 mg | Freq: Once | INTRAVENOUS | Status: AC
  Administered 2019-10-07: 400 mg via INTRAVENOUS
  Filled 2019-10-07: qty 200

## 2019-10-07 MED ORDER — LACTATED RINGERS IV BOLUS
1000.0000 mL | Freq: Once | INTRAVENOUS | Status: AC
  Administered 2019-10-07: 1000 mL via INTRAVENOUS

## 2019-10-07 MED ORDER — LIDOCAINE VISCOUS HCL 2 % MT SOLN
15.0000 mL | Freq: Once | OROMUCOSAL | Status: AC
  Administered 2019-10-07: 06:00:00 15 mL via ORAL
  Filled 2019-10-07: qty 15

## 2019-10-07 MED ORDER — METOPROLOL TARTRATE 5 MG/5ML IV SOLN: 5.0000 mg | Freq: Four times a day (QID) | INTRAVENOUS | Status: DC | PRN

## 2019-10-07 MED ORDER — DROPERIDOL 2.5 MG/ML IJ SOLN: 2.5000 mg | Freq: Once | INTRAMUSCULAR | Status: DC

## 2019-10-07 MED ORDER — BACLOFEN 10 MG PO TABS
5.0000 mg | ORAL_TABLET | Freq: Once | ORAL | Status: AC
  Administered 2019-10-07: 08:00:00 5 mg via ORAL
  Filled 2019-10-07: qty 1

## 2019-10-07 MED ORDER — ONDANSETRON 4 MG PO TBDP: 4.0000 mg | ORAL_TABLET | Freq: Four times a day (QID) | ORAL | Status: DC | PRN

## 2019-10-07 MED ORDER — MORPHINE SULFATE (PF) 2 MG/ML IV SOLN: 2.0000 mg | INTRAVENOUS | Status: DC | PRN

## 2019-10-07 MED ORDER — BACLOFEN 10 MG PO TABS: 10.0000 mg | ORAL_TABLET | Freq: Three times a day (TID) | ORAL | 0 refills | Status: DC

## 2019-10-07 MED ORDER — DIPHENHYDRAMINE HCL 50 MG/ML IJ SOLN: 25.0000 mg | Freq: Four times a day (QID) | INTRAMUSCULAR | Status: DC | PRN

## 2019-10-07 MED ORDER — ALUM & MAG HYDROXIDE-SIMETH 200-200-20 MG/5ML PO SUSP
30.0000 mL | Freq: Once | ORAL | Status: AC
  Administered 2019-10-07: 06:00:00 30 mL via ORAL
  Filled 2019-10-07: qty 30

## 2019-10-07 MED ORDER — IOHEXOL 350 MG/ML SOLN
100.0000 mL | Freq: Once | INTRAVENOUS | Status: AC | PRN
  Administered 2019-10-07: 06:00:00 100 mL via INTRAVENOUS

## 2019-10-07 MED ORDER — ONDANSETRON HCL 4 MG/2ML IJ SOLN
4.0000 mg | Freq: Four times a day (QID) | INTRAMUSCULAR | Status: DC | PRN
  Administered 2019-10-07 – 2019-10-09 (×4): 4 mg via INTRAVENOUS
  Filled 2019-10-07 (×4): qty 2

## 2019-10-07 MED ORDER — ZOLPIDEM TARTRATE 5 MG PO TABS: 5.0000 mg | ORAL_TABLET | Freq: Every evening | ORAL | Status: DC | PRN

## 2019-10-07 NOTE — ED Notes (Signed)
Ultrasound at bedside
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
## 2019-10-07 NOTE — ED Notes (Signed)
PT transported to xray.

## 2019-10-07 NOTE — ED Notes (Signed)
Dr. Daphine Deutscher is at bedside to speak with patient.

## 2019-10-07 NOTE — ED Notes (Addendum)
Pt transported to CT ?

## 2019-10-07 NOTE — Progress Notes (Signed)
MD ( ID doc. Dr Cliffton Asters) stated that pt's covid results from CDC showing first covid positive to be 09/20/19 and due to pt not being immunosuppressed or symptomatic that covid precautions could be discontinued since it has been more than ten days since first positive covid result.

## 2019-10-07 NOTE — ED Notes (Signed)
Patient ambulatory to the restroom at this time.
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
## 2019-10-07 NOTE — ED Notes (Signed)
Changed bed request order to COVID positive.

## 2019-10-07 NOTE — Progress Notes (Signed)
PHARMACY NOTE -  Ciprofloxacin  Pharmacy has been consulted to assist with dosing of ciprofloxacin for intra-abdominal infection.  Dosage remains stable at Cipro 400mg  IV q12h and need for further dosage adjustment appears unlikely at present.    Will sign off at this time.  Please reconsult if a change in clinical status warrants re-evaluation of dosage.  Thank you, , PharmD

## 2019-10-07 NOTE — ED Notes (Signed)
Surgical team at bedside

## 2019-10-07 NOTE — ED Notes (Signed)
Date and time results received: 10/07/19 2:41 PM  (use smartphrase ".now" to insert current time)  Test: COVID Critical Value: Positive  Name of Provider Notified: Chelsea RN  Orders Received? Or Actions Taken?: Orders Received - See Orders for details
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
## 2019-10-07 NOTE — ED Triage Notes (Signed)
Pt presents with chest pain tonight that she describes as "something trying to burst out." States that she called EMS and they did an EKG and told her that it looked good, but the pain has worsened.

## 2019-10-07 NOTE — ED Notes (Addendum)
Pt ambulated to restroom without concerns  

## 2019-10-07 NOTE — ED Notes (Signed)
Spoke to husband. That stated pt has hx of ulcers and chest wall muscle spasms. MD made aware.

## 2019-10-07 NOTE — ED Notes (Signed)
Went to bedside, patient is upset about being treated medically versus having surgery due to having a positive COVID test. Patient states she will test positive due to antibodies. Also, patient mentioned she had not received the same treatment since night shift and wanted to speak with a patient advocate. Notified [facility], Dr. Betha Loa called back stating he was going to have Dr. Corliss Blacker come speak with patient. Will inform patient of this information.

## 2019-10-07 NOTE — ED Notes (Signed)
Went back into room to update patient on Dr. Daphine Deutscher coming to assess patient. She stated she felt like she was dehydrated, had a migraine, and was hungry. Informed patient she had fluids infusing. Offered Fentanyl for pain, but declined the offer of Fentanyl for her pain. She states Dilaudid helped the most for her pain, which was a once dose order. Will continue to monitor patient and wait for Dr. Daphine Deutscher for further orders.

## 2019-10-07 NOTE — ED Provider Notes (Signed)
Patient checked out at 7:30 AM with ongoing abdominal pain.  Negative for dissection but still having epigastric and right upper quadrant pain.  Labs are relatively normal.  Ultrasound was pending.  Ultrasound returned with a 19 mm stone in the neck of the gallbladder with positive Murphy sign mild thickening and early signs of acute cholecystitis.  On repeat evaluation patient is still having 9 out of 10 pain and will have general surgery come and evaluate.   Gwyneth Sprout, MD 10/07/19 [phone]

## 2019-10-08 ENCOUNTER — Other Ambulatory Visit: Payer: Self-pay

## 2019-10-08 DIAGNOSIS — K819 Cholecystitis, unspecified: Secondary | ICD-10-CM

## 2019-10-08 DIAGNOSIS — Z8616 Personal history of COVID-19: Secondary | ICD-10-CM | POA: Diagnosis present

## 2019-10-08 LAB — CBC
HCT: 38.4 % (ref 36.0–46.0)
Hemoglobin: 12.4 g/dL (ref 12.0–15.0)
MCH: 32.5 pg (ref 26.0–34.0)
MCHC: 32.3 g/dL (ref 30.0–36.0)
MCV: 100.8 fL — ABNORMAL HIGH (ref 80.0–100.0)
Platelets: 283 10*3/uL (ref 150–400)
RBC: 3.81 MIL/uL — ABNORMAL LOW (ref 3.87–5.11)
RDW: 13 % (ref 11.5–15.5)
WBC: 5.8 10*3/uL (ref 4.0–10.5)
nRBC: 0 % (ref 0.0–0.2)

## 2019-10-08 LAB — COMPREHENSIVE METABOLIC PANEL
ALT: 63 U/L — ABNORMAL HIGH (ref 0–44)
AST: 52 U/L — ABNORMAL HIGH (ref 15–41)
Albumin: 3.2 g/dL — ABNORMAL LOW (ref 3.5–5.0)
Alkaline Phosphatase: 100 U/L (ref 38–126)
Anion gap: 6 (ref 5–15)
BUN: 6 mg/dL (ref 6–20)
CO2: 21 mmol/L — ABNORMAL LOW (ref 22–32)
Calcium: 7.8 mg/dL — ABNORMAL LOW (ref 8.9–10.3)
Chloride: 111 mmol/L (ref 98–111)
Creatinine, Ser: 0.52 mg/dL (ref 0.44–1.00)
GFR calc Af Amer: >60 mL/min (ref >60)
GFR calc non Af Amer: >60 mL/min (ref >60)
Glucose, Bld: 108 mg/dL — ABNORMAL HIGH (ref 70–99)
Potassium: 3.5 mmol/L (ref 3.5–5.1)
Sodium: 138 mmol/L (ref 135–145)
Total Bilirubin: 1.1 mg/dL (ref 0.3–1.2)
Total Protein: 6 g/dL — ABNORMAL LOW (ref 6.5–8.1)

## 2019-10-08 MED ORDER — DIPHENHYDRAMINE HCL 50 MG/ML IJ SOLN: 25.0000 mg | Freq: Once | INTRAMUSCULAR | Status: AC

## 2019-10-08 MED ORDER — HYDROMORPHONE HCL 1 MG/ML IJ SOLN
0.5000 mg | INTRAMUSCULAR | Status: DC | PRN
  Administered 2019-10-08 – 2019-10-10 (×7): 1 mg via INTRAVENOUS
  Filled 2019-10-08 (×7): qty 1

## 2019-10-08 MED ORDER — DIPHENHYDRAMINE HCL 50 MG/ML IJ SOLN
INTRAMUSCULAR | Status: AC
  Administered 2019-10-08: 25 mg via INTRAVENOUS
  Filled 2019-10-08: qty 1

## 2019-10-08 MED ORDER — KETOROLAC TROMETHAMINE 30 MG/ML IJ SOLN: 30.0000 mg | Freq: Once | INTRAMUSCULAR | Status: AC

## 2019-10-08 MED ORDER — TRAMADOL HCL 50 MG PO TABS
50.0000 mg | ORAL_TABLET | Freq: Four times a day (QID) | ORAL | Status: DC | PRN
  Administered 2019-10-08 – 2019-10-09 (×2): 50 mg via ORAL
  Filled 2019-10-08 (×2): qty 1

## 2019-10-08 MED ORDER — MAGNESIUM SULFATE 2 GM/50ML IV SOLN
2.0000 g | Freq: Once | INTRAVENOUS | Status: AC
  Administered 2019-10-08: 2 g via INTRAVENOUS
  Filled 2019-10-08: qty 50

## 2019-10-08 MED ORDER — KETOROLAC TROMETHAMINE 30 MG/ML IJ SOLN
30.0000 mg | Freq: Once | INTRAMUSCULAR | Status: DC
  Administered 2019-10-08: 30 mg via INTRAVENOUS
  Filled 2019-10-08: qty 1

## 2019-10-08 MED ORDER — DIPHENHYDRAMINE HCL 50 MG/ML IJ SOLN
25.0000 mg | Freq: Once | INTRAMUSCULAR | Status: DC
  Administered 2019-10-08: 02:00:00 25 mg via INTRAVENOUS
  Filled 2019-10-08: qty 1

## 2019-10-08 NOTE — Progress Notes (Signed)
PROGRESS NOTE  Colleen Hoffman XNA:[id number] DOB: 1975-11-18 DOA: 10/07/2019 PCP: Patient, No Pcp Per   LOS: 1 day   Brief narrative: As per HPI,  This is a 44 year old female with history of complex regional pain syndrome with spinal cord stimulator, anxiety, depression, abdominal hysterectomy, COVID-19 positive on 1/23 who came to ED on 2/9 with acute onset sharp chest pain and radiation between scapulas and down the back with associated shortness of breath, nausea and vomiting. Patient had negative CT dissection study for acute aortic syndrome, negative troponins x2 found to have positive RUQ ultrasound with 19 mm calculus in fixed position in the gallbladder neck with minimal gallbladder thickening with positive Murphy sign.  She was evaluated by general surgery initially planned on taking patient for surgery however she had repeat Covid testing which came back positive and surgery recommended medical management.  Assessment/Plan:  Active Problems:   Acute cholecystitis   Cholecystitis   History of COVID-19  Symptomatic cholelithiasis with early acute calculus cholecystitis.  Patient had right upper quadrant ultrasound which showed 19 mm gallstone in the gallbladder neck with cholecystitis.  On ciprofloxacin.  Continue analgesia for abdominal pain.  Patient has been having vomiting and abdominal pain.  Currently on Dilaudid Toradol and Lyrica.  Covid positive on 1/23. No hypoxia.  Mild change in the smell and taste but otherwise no infection.  Seen by infectious disease and recommended discontinuation of isolation.    Anxiety.  Continue Ativan  Complex regional pain syndrome with spinal cord stimulator On clears.  On IV pain medication at this time.  VTE Prophylaxis: Lovenox  Code Status: Full code  Family Communication: None  Disposition Plan:  . Patient is from home . Likely disposition to home . Barriers to discharge: Patient is still symptomatic with nausea vomiting  and abdominal pain.  Disposition plan as per primary team.  Procedures:  None  Antibiotics:  . Ciprofloxacin  Anti-infectives (From admission, onward)   Start     Dose/Rate Route Frequency Ordered Stop   10/07/19 2200  ciprofloxacin (CIPRO) IVPB 400 mg     400 mg 200 mL/hr over 60 Minutes Intravenous Every 12 hours 10/07/19 1149     10/07/19 1015  ciprofloxacin (CIPRO) IVPB 400 mg     400 mg 200 mL/hr over 60 Minutes Intravenous  Once 10/07/19 1007 10/07/19 1152     Subjective: Today, patient was seen and examined at bedside.  Patient complains of right upper quadrant pain with nausea and vomiting.  Had bowel movement this morning.  Objective: Vitals:   10/08/19 0548 10/08/19 1501  BP: 114/67 115/68  Pulse: 81 87  Resp:    Temp: 98.5 F (36.9 C) 98.4 F (36.9 C)  SpO2: 96% 97%   No intake or output data in the 24 hours ending 10/08/19 1535 Filed Weights   10/07/19 0350 10/07/19 1852  Weight: 87.1 kg 99.8 kg   Body mass index is 35.51 kg/m.   Physical Exam: GENERAL: Patient is alert awake and oriented, mildly anxious HENT: No scleral pallor or icterus. Pupils equally reactive to light. Oral mucosa is moist NECK: is supple, no gross swelling noted. CHEST: Clear to auscultation. No crackles or wheezes.  Diminished breath sounds bilaterally. CVS: S1 and S2 heard, no murmur. Regular rate and rhythm.  ABDOMEN: Soft, but right upper quadrant tenderness, bowel sounds are present. EXTREMITIES: No edema. CNS: Cranial nerves are intact. No focal motor deficits.  Mildly anxious SKIN: warm and dry without rashes.  Data Review:  I have personally reviewed the following laboratory data and studies,  CBC: Recent Labs  Lab 10/07/19 0504 10/08/19 0312  WBC 9.2 5.8  HGB 12.2 12.4  HCT 37.0 38.4  MCV 99.5 100.8*  PLT 307 283   Basic Metabolic Panel: Recent Labs  Lab 10/07/19 0444 10/08/19 0312  NA 139 138  K 4.0 3.5  CL 109 111  CO2 23 21*  GLUCOSE 113* 108*    BUN 15 6  CREATININE 0.65 0.52  CALCIUM 8.7* 7.8*   Liver Function Tests: Recent Labs  Lab 10/07/19 0551 10/08/19 0312  AST 21 52*  ALT 20 63*  ALKPHOS 57 100  BILITOT 1.1 1.1  PROT 6.5 6.0*  ALBUMIN 3.5 3.2*   Recent Labs  Lab 10/07/19 0551  LIPASE 24   No results for input(s): AMMONIA in the last 168 hours. Cardiac Enzymes: No results for input(s): CKTOTAL, CKMB, CKMBINDEX, TROPONINI in the last 168 hours. BNP (last 3 results) No results for input(s): BNP in the last 8760 hours.  ProBNP (last 3 results) No results for input(s): PROBNP in the last 8760 hours.  CBG: No results for input(s): GLUCAP in the last 168 hours. Recent Results (from the past 240 hour(s))  Respiratory Panel by RT PCR (Flu A&B, Covid) - Nasopharyngeal Swab     Status: Abnormal   Collection Time: 10/07/19 10:01 AM   Specimen: Nasopharyngeal Swab  Result Value Ref Range Status   SARS Coronavirus 2 by RT PCR POSITIVE (A) NEGATIVE Final    Comment: RESULT CALLED TO, READ BACK BY AND VERIFIED WITH: MCIVER,M. RN @1440  ON 02.09.2021 BY COHEN,K    Influenza A by PCR NEGATIVE NEGATIVE Final   Influenza B by PCR NEGATIVE NEGATIVE Final    Comment: Performed at [facility], [address], [place], [place]     Studies: DG Chest 2 View  Result Date: 10/07/2019 CLINICAL DATA:  Chest pain EXAM: CHEST - 2 VIEW COMPARISON:  None. FINDINGS: Normal heart size and mediastinal contours. No acute infiltrate or edema. No effusion or pneumothorax. Mild thoracic and lumbar levocurvature. No acute osseous findings. IMPRESSION: No active cardiopulmonary disease. Electronically Signed   By: 12/05/2019 M.D.   On: 10/07/2019 04:18   CT Angio Chest/Abd/Pel for Dissection W and/or Wo Contrast  Result Date: 10/07/2019 CLINICAL DATA:  Chest pain or back pain. Aortic dissection suspected EXAM: CT ANGIOGRAPHY CHEST, ABDOMEN AND PELVIS TECHNIQUE: Multidetector CT imaging through the chest,  abdomen and pelvis was performed using the standard protocol during bolus administration of intravenous contrast. Multiplanar reconstructed images and MIPs were obtained and reviewed to evaluate the vascular anatomy. CONTRAST:  12/05/2019 OMNIPAQUE IOHEXOL 350 MG/ML SOLN COMPARISON:  None. FINDINGS: CTA CHEST FINDINGS Cardiovascular: No intramural hematoma on noncontrast phase. No aortic dissection, aneurysm, or periaortic inflammation. Mediastinum/Nodes: Negative for adenopathy the, soft G ule thickening, or inflammatory changes Lungs/Pleura: Central airways are clear. There is no edema, consolidation, effusion, or pneumothorax. Musculoskeletal: Negative Review of the MIP images confirms the above findings. CTA ABDOMEN AND PELVIS FINDINGS VASCULAR Aorta: Normal caliber aorta without aneurysm, dissection, vasculitis or significant stenosis. Celiac: Vessels are smoothly contoured and non atheromatous. Median arcuate ligament impression. 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 No stenosis or beading IMA: Patent Inflow: Unremarkable Veins: Negative in the arterial phase Review of the MIP images confirms the above findings. NON-VASCULAR Hepatobiliary: No focal liver abnormality.Small calcified gallstone. No pericholecystic edema, no definite wall thickening.  Pancreas: Unremarkable. Spleen: Unremarkable. Adrenals/Urinary Tract: Negative adrenals. No hydronephrosis or stone. Unremarkable bladder. Stomach/Bowel: No obstruction. No appendicitis. Formed stool in multiple colonic segments without rectal impaction or over distention. Lymphatic: No mass or adenopathy. Reproductive:Hysterectomy. Other: No ascites or pneumoperitoneum. Musculoskeletal: No acute abnormalities. Lumbosacral neural stimulator. Mild thoracic and lumbar scoliosis. Review of the MIP images confirms the above findings. IMPRESSION: 1. Negative acute aortic syndrome. 2. Cholelithiasis. Electronically  Signed   By: Monte Fantasia M.D.   On: 10/07/2019 06:58   US Abdomen Limited RUQ  Result Date: 10/07/2019 CLINICAL DATA:  Epigastric and chest pain EXAM: ULTRASOUND ABDOMEN LIMITED RIGHT UPPER QUADRANT COMPARISON:  None FINDINGS: Gallbladder: Shadowing calculus 19 mm diameter fixed in position at the gallbladder neck. Minimal gallbladder wall thickening. Sonographic Murphy sign present. No definite pericholecystic fluid. Findings suspicious for early acute cholecystitis. Common bile duct: Diameter: 3 mm, normal Liver: Normal parenchymal echogenicity. No definite mass or nodularity. Portal vein is patent on color Doppler imaging with normal direction of blood flow towards the liver. Other: No RIGHT upper quadrant free fluid. IMPRESSION: 19 mm diameter gallstone fixed in position at gallbladder neck with minimal gallbladder wall thickening and sonographic Murphy sign suspicious for early acute cholecystitis. No biliary dilatation or focal hepatic abnormality. Electronically Signed   By: Lavonia Dana M.D.   On: 10/07/2019 08:16      Flora Lipps, MD  [facility] 10/08/2019

## 2019-10-08 NOTE — Plan of Care (Signed)
  Problem: Education: Goal: Knowledge of General Education information will improve Description: Including pain rating scale, medication(s)/side effects and non-pharmacologic comfort measures Outcome: Progressing  Continue with current POC

## 2019-10-08 NOTE — Progress Notes (Signed)
Pt c/o of severe migraine pain. Dilaudid only works briefly for the pain. Spoke with Webb Silversmith, NP and new orders given: migraine cocktail.

## 2019-10-08 NOTE — H&P (View-Only) (Signed)
Subjective: CC: HA and abdominal pain, n/v  Patient complains of migraine and abdominal pain. She reports that her abdominal pain is constant, generalized and most severe in the epigastrium and RUQ with radiation to her back, between her scapula's. She notes that she has constant nausea and had 3 episodes of emesis yesterday. She does not usually have n/v with her migraines.   ROS: See above, otherwise other systems negative   Objective: Vital signs in last 24 hours: Temp:  [98.5 F (36.9 C)-98.7 F (37.1 C)] 98.5 F (36.9 C) (02/10 0548) Pulse Rate:  [70-103] 81 (02/10 0548) Resp:  [10-22] 18 (02/09 2009) BP: (112-144)/(61-85) 114/67 (02/10 0548) SpO2:  [95 %-100 %] 96 % (02/10 0548) Weight:  [99.8 kg] 99.8 kg (02/09 1852) Last BM Date: 10/07/19  Intake/Output from previous day: No intake/output data recorded. Intake/Output this shift: No intake/output data recorded.  PE: Gen:  Alert, NAD, pleasant Card:  RRR Pulm:  CTAB, no W/R/R, effort normal Abd: Soft, ND, generalized tenderness greatest in the RUQ and epigastrium with some voluntary guarding. There is no involuntary guarding, rebound or rigidity. Negative Murphy's sign. +BS Ext:  No LE edema  Psych: A&Ox3  Skin: no rashes noted, warm and dry  Lab Results:  Recent Labs    10/07/19 0504 10/08/19 0312  WBC 9.2 5.8  HGB 12.2 12.4  HCT 37.0 38.4  PLT 307 283   BMET Recent Labs    10/07/19 0444 10/08/19 0312  NA 139 138  K 4.0 3.5  CL 109 111  CO2 23 21*  GLUCOSE 113* 108*  BUN 15 6  CREATININE 0.65 0.52  CALCIUM 8.7* 7.8*   PT/INR No results for input(s): LABPROT, INR in the last 72 hours. CMP     Component Value Date/Time   NA 138 10/08/2019 0312   K 3.5 10/08/2019 0312   CL 111 10/08/2019 0312   CO2 21 (L) 10/08/2019 0312   GLUCOSE 108 (H) 10/08/2019 0312   BUN 6 10/08/2019 0312   CREATININE 0.52 10/08/2019 0312   CALCIUM 7.8 (L) 10/08/2019 0312   PROT 6.0 (L) 10/08/2019 0312   ALBUMIN 3.2 (L) 10/08/2019 0312   AST 52 (H) 10/08/2019 0312   ALT 63 (H) 10/08/2019 0312   ALKPHOS 100 10/08/2019 0312   BILITOT 1.1 10/08/2019 0312   GFRNONAA >60 10/08/2019 0312   GFRAA >60 10/08/2019 0312   Lipase     Component Value Date/Time   LIPASE 24 10/07/2019 0551       Studies/Results: DG Chest 2 View  Result Date: 10/07/2019 CLINICAL DATA:  Chest pain EXAM: CHEST - 2 VIEW COMPARISON:  None. FINDINGS: Normal heart size and mediastinal contours. No acute infiltrate or edema. No effusion or pneumothorax. Mild thoracic and lumbar levocurvature. No acute osseous findings. IMPRESSION: No active cardiopulmonary disease. Electronically Signed   By: Monte Fantasia M.D.   On: 10/07/2019 04:18   CT Angio Chest/Abd/Pel for Dissection W and/or Wo Contrast  Result Date: 10/07/2019 CLINICAL DATA:  Chest pain or back pain. Aortic dissection suspected EXAM: CT ANGIOGRAPHY CHEST, ABDOMEN AND PELVIS TECHNIQUE: Multidetector CT imaging through the chest, abdomen and pelvis was performed using the standard protocol during bolus administration of intravenous contrast. Multiplanar reconstructed images and MIPs were obtained and reviewed to evaluate the vascular anatomy. CONTRAST:  156m OMNIPAQUE IOHEXOL 350 MG/ML SOLN COMPARISON:  None. FINDINGS: CTA CHEST FINDINGS Cardiovascular: No intramural hematoma on noncontrast phase. No aortic dissection, aneurysm, or periaortic inflammation.  Mediastinum/Nodes: Negative for adenopathy the, soft G ule thickening, or inflammatory changes Lungs/Pleura: Central airways are clear. There is no edema, consolidation, effusion, or pneumothorax. Musculoskeletal: Negative Review of the MIP images confirms the above findings. CTA ABDOMEN AND PELVIS FINDINGS VASCULAR Aorta: Normal caliber aorta without aneurysm, dissection, vasculitis or significant stenosis. Celiac: Vessels are smoothly contoured and non atheromatous. Median arcuate ligament impression. 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 No stenosis or beading IMA: Patent Inflow: Unremarkable Veins: Negative in the arterial phase Review of the MIP images confirms the above findings. NON-VASCULAR Hepatobiliary: No focal liver abnormality.Small calcified gallstone. No pericholecystic edema, no definite wall thickening. Pancreas: Unremarkable. Spleen: Unremarkable. Adrenals/Urinary Tract: Negative adrenals. No hydronephrosis or stone. Unremarkable bladder. Stomach/Bowel: No obstruction. No appendicitis. Formed stool in multiple colonic segments without rectal impaction or over distention. Lymphatic: No mass or adenopathy. Reproductive:Hysterectomy. Other: No ascites or pneumoperitoneum. Musculoskeletal: No acute abnormalities. Lumbosacral neural stimulator. Mild thoracic and lumbar scoliosis. Review of the MIP images confirms the above findings. IMPRESSION: 1. Negative acute aortic syndrome. 2. Cholelithiasis. Electronically Signed   By: Monte Fantasia M.D.   On: 10/07/2019 06:58   US Abdomen Limited RUQ  Result Date: 10/07/2019 CLINICAL DATA:  Epigastric and chest pain EXAM: ULTRASOUND ABDOMEN LIMITED RIGHT UPPER QUADRANT COMPARISON:  None FINDINGS: Gallbladder: Shadowing calculus 19 mm diameter fixed in position at the gallbladder neck. Minimal gallbladder wall thickening. Sonographic Murphy sign present. No definite pericholecystic fluid. Findings suspicious for early acute cholecystitis. Common bile duct: Diameter: 3 mm, normal Liver: Normal parenchymal echogenicity. No definite mass or nodularity. Portal vein is patent on color Doppler imaging with normal direction of blood flow towards the liver. Other: No RIGHT upper quadrant free fluid. IMPRESSION: 19 mm diameter gallstone fixed in position at gallbladder neck with minimal gallbladder wall thickening and sonographic Murphy sign suspicious for early acute cholecystitis. No biliary dilatation or focal  hepatic abnormality. Electronically Signed   By: Lavonia Dana M.D.   On: 10/07/2019 08:16    Anti-infectives: Anti-infectives (From admission, onward)   Start     Dose/Rate Route Frequency Ordered Stop   10/07/19 2200  ciprofloxacin (CIPRO) IVPB 400 mg     400 mg 200 mL/hr over 60 Minutes Intravenous Every 12 hours 10/07/19 1149     10/07/19 1015  ciprofloxacin (CIPRO) IVPB 400 mg     400 mg 200 mL/hr over 60 Minutes Intravenous  Once 10/07/19 1007 10/07/19 1152       Assessment/Plan COVID+ - Quarantined on 1/16. Positive test on 1/23 and 2/9. Appreciate TRH help  Symptomatic Cholelithiasis with likely early acute cholecystitis - RUQ [place] w/ 19 mm diameter gallstone fixed in position at gallbladder neck w/ minimal GB wall thickening. No pericholecystic fluid. CBD 85m on [place]- WBC wnl. AST elevated at 52, ALT elevated at 63. Alk phos and t bili wnl. - Patient LFT's uptrending and she is still symptomatic  I reached out to Dr. JMichel Bickersof ID to weigh in about risks of anesthesia, surgery, and if patient requires to still be on precautions. Will await recommendations. For now leave NPO. Pending ID recs, will give further updates if continuing with medical management vs surgical plans.   FEN: NPO, IVF VTE: SCDs, Lovenox  ID: Cipro 2/9 >> Foley: None Follow-Up: Dr. MHassell Done  LOS: 1 day    MJillyn Ledger, PM[facility] 10/08/2019, 10:37 AM Please see Amion for pager number during  day hours 7:00am-4:30pm

## 2019-10-08 NOTE — Progress Notes (Signed)
Subjective: CC: HA and abdominal pain, n/v  Patient complains of migraine and abdominal pain. She reports that her abdominal pain is constant, generalized and most severe in the epigastrium and RUQ with radiation to her back, between her scapula's. She notes that she has constant nausea and had 3 episodes of emesis yesterday. She does not usually have n/v with her migraines.   ROS: See above, otherwise other systems negative   Objective: Vital signs in last 24 hours: Temp:  [98.5 F (36.9 C)-98.7 F (37.1 C)] 98.5 F (36.9 C) (02/10 0548) Pulse Rate:  [70-103] 81 (02/10 0548) Resp:  [10-22] 18 (02/09 2009) BP: (112-144)/(61-85) 114/67 (02/10 0548) SpO2:  [95 %-100 %] 96 % (02/10 0548) Weight:  [99.8 kg] 99.8 kg (02/09 1852) Last BM Date: 10/07/19  Intake/Output from previous day: No intake/output data recorded. Intake/Output this shift: No intake/output data recorded.  PE: Gen:  Alert, NAD, pleasant Card:  RRR Pulm:  CTAB, no W/R/R, effort normal Abd: Soft, ND, generalized tenderness greatest in the RUQ and epigastrium with some voluntary guarding. There is no involuntary guarding, rebound or rigidity. Negative Murphy's sign. +BS Ext:  No LE edema  Psych: A&Ox3  Skin: no rashes noted, warm and dry  Lab Results:  Recent Labs    10/07/19 0504 10/08/19 0312  WBC 9.2 5.8  HGB 12.2 12.4  HCT 37.0 38.4  PLT 307 283   BMET Recent Labs    10/07/19 0444 10/08/19 0312  NA 139 138  K 4.0 3.5  CL 109 111  CO2 23 21*  GLUCOSE 113* 108*  BUN 15 6  CREATININE 0.65 0.52  CALCIUM 8.7* 7.8*   PT/INR No results for input(s): LABPROT, INR in the last 72 hours. CMP     Component Value Date/Time   NA 138 10/08/2019 0312   K 3.5 10/08/2019 0312   CL 111 10/08/2019 0312   CO2 21 (L) 10/08/2019 0312   GLUCOSE 108 (H) 10/08/2019 0312   BUN 6 10/08/2019 0312   CREATININE 0.52 10/08/2019 0312   CALCIUM 7.8 (L) 10/08/2019 0312   PROT 6.0 (L) 10/08/2019 0312   ALBUMIN 3.2 (L) 10/08/2019 0312   AST 52 (H) 10/08/2019 0312   ALT 63 (H) 10/08/2019 0312   ALKPHOS 100 10/08/2019 0312   BILITOT 1.1 10/08/2019 0312   GFRNONAA >60 10/08/2019 0312   GFRAA >60 10/08/2019 0312   Lipase     Component Value Date/Time   LIPASE 24 10/07/2019 0551       Studies/Results: DG Chest 2 View  Result Date: 10/07/2019 CLINICAL DATA:  Chest pain EXAM: CHEST - 2 VIEW COMPARISON:  None. FINDINGS: Normal heart size and mediastinal contours. No acute infiltrate or edema. No effusion or pneumothorax. Mild thoracic and lumbar levocurvature. No acute osseous findings. IMPRESSION: No active cardiopulmonary disease. Electronically Signed   By: Monte Fantasia M.D.   On: 10/07/2019 04:18   CT Angio Chest/Abd/Pel for Dissection W and/or Wo Contrast  Result Date: 10/07/2019 CLINICAL DATA:  Chest pain or back pain. Aortic dissection suspected EXAM: CT ANGIOGRAPHY CHEST, ABDOMEN AND PELVIS TECHNIQUE: Multidetector CT imaging through the chest, abdomen and pelvis was performed using the standard protocol during bolus administration of intravenous contrast. Multiplanar reconstructed images and MIPs were obtained and reviewed to evaluate the vascular anatomy. CONTRAST:  177m OMNIPAQUE IOHEXOL 350 MG/ML SOLN COMPARISON:  None. FINDINGS: CTA CHEST FINDINGS Cardiovascular: No intramural hematoma on noncontrast phase. No aortic dissection, aneurysm, or periaortic inflammation.  Mediastinum/Nodes: Negative for adenopathy the, soft G ule thickening, or inflammatory changes Lungs/Pleura: Central airways are clear. There is no edema, consolidation, effusion, or pneumothorax. Musculoskeletal: Negative Review of the MIP images confirms the above findings. CTA ABDOMEN AND PELVIS FINDINGS VASCULAR Aorta: Normal caliber aorta without aneurysm, dissection, vasculitis or significant stenosis. Celiac: Vessels are smoothly contoured and non atheromatous. Median arcuate ligament impression. 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 No stenosis or beading IMA: Patent Inflow: Unremarkable Veins: Negative in the arterial phase Review of the MIP images confirms the above findings. NON-VASCULAR Hepatobiliary: No focal liver abnormality.Small calcified gallstone. No pericholecystic edema, no definite wall thickening. Pancreas: Unremarkable. Spleen: Unremarkable. Adrenals/Urinary Tract: Negative adrenals. No hydronephrosis or stone. Unremarkable bladder. Stomach/Bowel: No obstruction. No appendicitis. Formed stool in multiple colonic segments without rectal impaction or over distention. Lymphatic: No mass or adenopathy. Reproductive:Hysterectomy. Other: No ascites or pneumoperitoneum. Musculoskeletal: No acute abnormalities. Lumbosacral neural stimulator. Mild thoracic and lumbar scoliosis. Review of the MIP images confirms the above findings. IMPRESSION: 1. Negative acute aortic syndrome. 2. Cholelithiasis. Electronically Signed   By: Monte Fantasia M.D.   On: 10/07/2019 06:58   US Abdomen Limited RUQ  Result Date: 10/07/2019 CLINICAL DATA:  Epigastric and chest pain EXAM: ULTRASOUND ABDOMEN LIMITED RIGHT UPPER QUADRANT COMPARISON:  None FINDINGS: Gallbladder: Shadowing calculus 19 mm diameter fixed in position at the gallbladder neck. Minimal gallbladder wall thickening. Sonographic Murphy sign present. No definite pericholecystic fluid. Findings suspicious for early acute cholecystitis. Common bile duct: Diameter: 3 mm, normal Liver: Normal parenchymal echogenicity. No definite mass or nodularity. Portal vein is patent on color Doppler imaging with normal direction of blood flow towards the liver. Other: No RIGHT upper quadrant free fluid. IMPRESSION: 19 mm diameter gallstone fixed in position at gallbladder neck with minimal gallbladder wall thickening and sonographic Murphy sign suspicious for early acute cholecystitis. No biliary dilatation or focal  hepatic abnormality. Electronically Signed   By: Lavonia Dana M.D.   On: 10/07/2019 08:16    Anti-infectives: Anti-infectives (From admission, onward)   Start     Dose/Rate Route Frequency Ordered Stop   10/07/19 2200  ciprofloxacin (CIPRO) IVPB 400 mg     400 mg 200 mL/hr over 60 Minutes Intravenous Every 12 hours 10/07/19 1149     10/07/19 1015  ciprofloxacin (CIPRO) IVPB 400 mg     400 mg 200 mL/hr over 60 Minutes Intravenous  Once 10/07/19 1007 10/07/19 1152       Assessment/Plan COVID+ - Quarantined on 1/16. Positive test on 1/23 and 2/9. Appreciate TRH help  Symptomatic Cholelithiasis with likely early acute cholecystitis - RUQ [place] w/ 19 mm diameter gallstone fixed in position at gallbladder neck w/ minimal GB wall thickening. No pericholecystic fluid. CBD 45m on [place]- WBC wnl. AST elevated at 52, ALT elevated at 63. Alk phos and t bili wnl. - Patient LFT's uptrending and she is still symptomatic  I reached out to Dr. JMichel Bickersof ID to weigh in about risks of anesthesia, surgery, and if patient requires to still be on precautions. Will await recommendations. For now leave NPO. Pending ID recs, will give further updates if continuing with medical management vs surgical plans.   FEN: NPO, IVF VTE: SCDs, Lovenox  ID: Cipro 2/9 >> Foley: None Follow-Up: Dr. MHassell Done  LOS: 1 day    MJillyn Ledger, PSelect Specialty HospitalSur[facility] 10/08/2019, 10:37 AM Please see Amion for pager number during  day hours 7:00am-4:30pm

## 2019-10-08 NOTE — Consult Note (Signed)
         [facility]    Date of Admission:  10/07/2019     Patient Active Problem List   Diagnosis Date Noted  . History of COVID-19 10/08/2019  . Acute cholecystitis 10/07/2019  . Cholecystitis 10/07/2019   Colleen Hoffman is a 44 year old female who was diagnosed with mild Covid infection on 09/20/2019.  She completed her quarantine period but has developed acute cholecystitis leading to admission yesterday.  She is not hypoxic and has no evidence of pneumonia by chest CT scan.  As noted in our current isolation duration guidelines below she is out of her a 10-day window.  She did not need to be retested upon admission and she does not need isolation.  I have reviewed this with Dr. Judyann Munson, head of infection prevention for the health system, and she is in agreement.  Ms. Beshara previous diagnosis of Covid is not a contraindication to proceeding with surgery if that is what is clinically indicated.  Plan: 1. Discontinue airborne and contact precautions            Cliffton Asters, MD [facility] [phone] pager   [phone] cell 10/08/2019, 12:13 PM

## 2019-10-09 ENCOUNTER — Inpatient Hospital Stay (HOSPITAL_COMMUNITY): Payer: No Typology Code available for payment source

## 2019-10-09 ENCOUNTER — Encounter (HOSPITAL_COMMUNITY)
Admission: EM | Disposition: A | Discharge status: Home / Self Care | Payer: Self-pay | Source: Home / Self Care | Attending: Internal Medicine

## 2019-10-09 ENCOUNTER — Inpatient Hospital Stay (HOSPITAL_COMMUNITY): Payer: No Typology Code available for payment source | Admitting: Certified Registered"

## 2019-10-09 ENCOUNTER — Encounter (HOSPITAL_COMMUNITY): Payer: Self-pay | Admitting: Internal Medicine

## 2019-10-09 HISTORY — PX: CHOLECYSTECTOMY: SHX55

## 2019-10-09 LAB — CBC
HCT: 39.2 % (ref 36.0–46.0)
Hemoglobin: 12.5 g/dL (ref 12.0–15.0)
MCH: 32.8 pg (ref 26.0–34.0)
MCHC: 31.9 g/dL (ref 30.0–36.0)
MCV: 102.9 fL — ABNORMAL HIGH (ref 80.0–100.0)
Platelets: 294 10*3/uL (ref 150–400)
RBC: 3.81 MIL/uL — ABNORMAL LOW (ref 3.87–5.11)
RDW: 12.8 % (ref 11.5–15.5)
WBC: 5.5 10*3/uL (ref 4.0–10.5)
nRBC: 0 % (ref 0.0–0.2)

## 2019-10-09 LAB — COMPREHENSIVE METABOLIC PANEL
ALT: 45 U/L — ABNORMAL HIGH (ref 0–44)
AST: 26 U/L (ref 15–41)
Albumin: 3.1 g/dL — ABNORMAL LOW (ref 3.5–5.0)
Alkaline Phosphatase: 75 U/L (ref 38–126)
Anion gap: 5 (ref 5–15)
BUN: <5 mg/dL — ABNORMAL LOW (ref 6–20)
CO2: 21 mmol/L — ABNORMAL LOW (ref 22–32)
Calcium: 7.9 mg/dL — ABNORMAL LOW (ref 8.9–10.3)
Chloride: 112 mmol/L — ABNORMAL HIGH (ref 98–111)
Creatinine, Ser: 0.5 mg/dL (ref 0.44–1.00)
GFR calc Af Amer: >60 mL/min (ref >60)
GFR calc non Af Amer: >60 mL/min (ref >60)
Glucose, Bld: 103 mg/dL — ABNORMAL HIGH (ref 70–99)
Potassium: 3.8 mmol/L (ref 3.5–5.1)
Sodium: 138 mmol/L (ref 135–145)
Total Bilirubin: 1.1 mg/dL (ref 0.3–1.2)
Total Protein: 5.8 g/dL — ABNORMAL LOW (ref 6.5–8.1)

## 2019-10-09 LAB — MAGNESIUM: Magnesium: 2.1 mg/dL (ref 1.7–2.4)

## 2019-10-09 LAB — MRSA PCR SCREENING: MRSA by PCR: NEGATIVE

## 2019-10-09 SURGERY — LAPAROSCOPIC CHOLECYSTECTOMY WITH INTRAOPERATIVE CHOLANGIOGRAM
Anesthesia: General | Site: Abdomen

## 2019-10-09 MED ORDER — FENTANYL CITRATE (PF) 250 MCG/5ML IJ SOLN
INTRAMUSCULAR | Status: AC
  Filled 2019-10-09: qty 5

## 2019-10-09 MED ORDER — ONDANSETRON 4 MG PO TBDP: 4.0000 mg | ORAL_TABLET | Freq: Four times a day (QID) | ORAL | Status: DC | PRN

## 2019-10-09 MED ORDER — SUCCINYLCHOLINE CHLORIDE 200 MG/10ML IV SOSY
PREFILLED_SYRINGE | INTRAVENOUS | Status: DC | PRN
  Administered 2019-10-09: 100 mg via INTRAVENOUS

## 2019-10-09 MED ORDER — SODIUM CHLORIDE 0.9 % IV SOLN: 20.0000 mL | Freq: Once | INTRAVENOUS | Status: DC

## 2019-10-09 MED ORDER — SUGAMMADEX SODIUM 500 MG/5ML IV SOLN
INTRAVENOUS | Status: DC | PRN
  Administered 2019-10-09: 300 mg via INTRAVENOUS

## 2019-10-09 MED ORDER — ROCURONIUM BROMIDE 10 MG/ML (PF) SYRINGE
PREFILLED_SYRINGE | INTRAVENOUS | Status: AC
  Filled 2019-10-09: qty 10

## 2019-10-09 MED ORDER — PANTOPRAZOLE SODIUM 40 MG IV SOLR
40.0000 mg | Freq: Every day | INTRAVENOUS | Status: DC
  Administered 2019-10-09: 40 mg via INTRAVENOUS
  Filled 2019-10-09: qty 40

## 2019-10-09 MED ORDER — FENTANYL CITRATE (PF) 100 MCG/2ML IJ SOLN: 25.0000 ug | INTRAMUSCULAR | Status: DC | PRN

## 2019-10-09 MED ORDER — ONDANSETRON HCL 4 MG/2ML IJ SOLN
INTRAMUSCULAR | Status: AC
  Filled 2019-10-09: qty 2

## 2019-10-09 MED ORDER — MIDAZOLAM HCL 2 MG/2ML IJ SOLN
INTRAMUSCULAR | Status: AC
  Filled 2019-10-09: qty 2

## 2019-10-09 MED ORDER — HEPARIN SODIUM (PORCINE) 5000 UNIT/ML IJ SOLN: 5000.0000 [IU] | Freq: Three times a day (TID) | INTRAMUSCULAR | Status: DC

## 2019-10-09 MED ORDER — SUGAMMADEX SODIUM 500 MG/5ML IV SOLN
INTRAVENOUS | Status: AC
  Filled 2019-10-09: qty 5

## 2019-10-09 MED ORDER — HYDROCODONE-ACETAMINOPHEN 7.5-325 MG PO TABS: 1.0000 | ORAL_TABLET | Freq: Once | ORAL | Status: DC | PRN

## 2019-10-09 MED ORDER — MIDAZOLAM HCL 2 MG/2ML IJ SOLN
INTRAMUSCULAR | Status: DC | PRN
  Administered 2019-10-09: 2 mg via INTRAVENOUS

## 2019-10-09 MED ORDER — DEXAMETHASONE SODIUM PHOSPHATE 10 MG/ML IJ SOLN
INTRAMUSCULAR | Status: DC | PRN
  Administered 2019-10-09: 8 mg via INTRAVENOUS

## 2019-10-09 MED ORDER — BUPIVACAINE HCL 0.25 % IJ SOLN
INTRAMUSCULAR | Status: AC
  Filled 2019-10-09: qty 1

## 2019-10-09 MED ORDER — LIDOCAINE 2% (20 MG/ML) 5 ML SYRINGE
INTRAMUSCULAR | Status: AC
  Filled 2019-10-09: qty 5

## 2019-10-09 MED ORDER — LIDOCAINE 2% (20 MG/ML) 5 ML SYRINGE
INTRAMUSCULAR | Status: DC | PRN
  Administered 2019-10-09: 100 mg via INTRAVENOUS
  Administered 2019-10-09: 60 mg via INTRAVENOUS

## 2019-10-09 MED ORDER — PROPOFOL 10 MG/ML IV BOLUS
INTRAVENOUS | Status: DC | PRN
  Administered 2019-10-09: 200 mg via INTRAVENOUS

## 2019-10-09 MED ORDER — LACTATED RINGERS IV SOLN: INTRAVENOUS | Status: DC

## 2019-10-09 MED ORDER — LACTATED RINGERS IV SOLN: INTRAVENOUS | Status: DC | PRN

## 2019-10-09 MED ORDER — ONDANSETRON HCL 4 MG/2ML IJ SOLN
INTRAMUSCULAR | Status: DC | PRN
  Administered 2019-10-09: 4 mg via INTRAVENOUS

## 2019-10-09 MED ORDER — KCL IN DEXTROSE-NACL 20-5-0.45 MEQ/L-%-% IV SOLN
INTRAVENOUS | Status: DC
  Filled 2019-10-09 (×2): qty 1000

## 2019-10-09 MED ORDER — ONDANSETRON HCL 4 MG/2ML IJ SOLN: 4.0000 mg | Freq: Once | INTRAMUSCULAR | Status: DC | PRN

## 2019-10-09 MED ORDER — IOPAMIDOL (ISOVUE-300) INJECTION 61%
INTRAVENOUS | Status: DC | PRN
  Administered 2019-10-09: 5 mL

## 2019-10-09 MED ORDER — PROPOFOL 10 MG/ML IV BOLUS
INTRAVENOUS | Status: AC
  Filled 2019-10-09: qty 20

## 2019-10-09 MED ORDER — METOPROLOL TARTRATE 5 MG/5ML IV SOLN: 5.0000 mg | Freq: Four times a day (QID) | INTRAVENOUS | Status: DC | PRN

## 2019-10-09 MED ORDER — BUPIVACAINE LIPOSOME 1.3 % IJ SUSP
20.0000 mL | Freq: Once | INTRAMUSCULAR | Status: DC
  Filled 2019-10-09: qty 20

## 2019-10-09 MED ORDER — DEXAMETHASONE SODIUM PHOSPHATE 10 MG/ML IJ SOLN
INTRAMUSCULAR | Status: AC
  Filled 2019-10-09: qty 1

## 2019-10-09 MED ORDER — FENTANYL CITRATE (PF) 250 MCG/5ML IJ SOLN
INTRAMUSCULAR | Status: DC | PRN
  Administered 2019-10-09: 50 ug via INTRAVENOUS
  Administered 2019-10-09 (×2): 100 ug via INTRAVENOUS
  Administered 2019-10-09: 150 ug via INTRAVENOUS
  Administered 2019-10-09 (×2): 50 ug via INTRAVENOUS

## 2019-10-09 MED ORDER — ROCURONIUM BROMIDE 10 MG/ML (PF) SYRINGE
PREFILLED_SYRINGE | INTRAVENOUS | Status: DC | PRN
  Administered 2019-10-09: 5 mg via INTRAVENOUS
  Administered 2019-10-09: 40 mg via INTRAVENOUS
  Administered 2019-10-09 (×2): 5 mg via INTRAVENOUS

## 2019-10-09 MED ORDER — ONDANSETRON HCL 4 MG/2ML IJ SOLN
4.0000 mg | Freq: Four times a day (QID) | INTRAMUSCULAR | Status: DC | PRN
  Administered 2019-10-09 – 2019-10-10 (×2): 4 mg via INTRAVENOUS
  Filled 2019-10-09 (×2): qty 2

## 2019-10-09 MED ORDER — BUPIVACAINE LIPOSOME 1.3 % IJ SUSP
INTRAMUSCULAR | Status: DC | PRN
  Administered 2019-10-09: 20 mL

## 2019-10-09 MED ORDER — SUCCINYLCHOLINE CHLORIDE 200 MG/10ML IV SOSY
PREFILLED_SYRINGE | INTRAVENOUS | Status: AC
  Filled 2019-10-09: qty 10

## 2019-10-09 MED ORDER — LACTATED RINGERS IR SOLN
Status: DC | PRN
  Administered 2019-10-09: 1000 mL

## 2019-10-09 SURGICAL SUPPLY — 40 items
APPLICATOR COTTON TIP 6 STRL (MISCELLANEOUS) ×2 IMPLANT
APPLICATOR COTTON TIP 6IN STRL (MISCELLANEOUS) ×4 IMPLANT
APPLIER CLIP ROT 10 11.4 M/L (STAPLE) ×2
BENZOIN TINCTURE PRP APPL 2/3 (GAUZE/BANDAGES/DRESSINGS) IMPLANT
CABLE HIGH FREQUENCY MONO STRZ (ELECTRODE) ×2 IMPLANT
CATH REDDICK CHOLANGI 4FR 50CM (CATHETERS) ×2 IMPLANT
CLIP APPLIE ROT 10 11.4 M/L (STAPLE) ×1 IMPLANT
COVER MAYO STAND STRL (DRAPES) ×2 IMPLANT
COVER SURGICAL LIGHT HANDLE (MISCELLANEOUS) ×2 IMPLANT
COVER WAND RF STERILE (DRAPES) IMPLANT
DECANTER SPIKE VIAL GLASS SM (MISCELLANEOUS) ×2 IMPLANT
DERMABOND ADVANCED (GAUZE/BANDAGES/DRESSINGS) ×1
DERMABOND ADVANCED .7 DNX12 (GAUZE/BANDAGES/DRESSINGS) ×1 IMPLANT
DRAPE C-ARM 42X120 X-RAY (DRAPES) ×2 IMPLANT
ELECT REM PT RETURN 15FT ADLT (MISCELLANEOUS) ×2 IMPLANT
GLOVE BIOGEL M 8.0 STRL (GLOVE) ×2 IMPLANT
GOWN STRL REUS W/TWL XL LVL3 (GOWN DISPOSABLE) ×6 IMPLANT
GRASPER SUT TROCAR 14GX15 (MISCELLANEOUS) ×1 IMPLANT
HEMOSTAT SURGICEL 4X8 (HEMOSTASIS) IMPLANT
IV CATH 14GX2 1/4 (CATHETERS) ×2 IMPLANT
KIT BASIN OR (CUSTOM PROCEDURE TRAY) ×2 IMPLANT
KIT TURNOVER KIT A (KITS) IMPLANT
L-HOOK LAP DISP 36CM (ELECTROSURGICAL) ×2
LHOOK LAP DISP 36CM (ELECTROSURGICAL) IMPLANT
PENCIL SMOKE EVACUATOR (MISCELLANEOUS) IMPLANT
POUCH RETRIEVAL ECOSAC 10 (ENDOMECHANICALS) IMPLANT
POUCH RETRIEVAL ECOSAC 10MM (ENDOMECHANICALS)
SCISSORS LAP 5X45 EPIX DISP (ENDOMECHANICALS) ×2 IMPLANT
SET IRRIG TUBING LAPAROSCOPIC (IRRIGATION / IRRIGATOR) ×2 IMPLANT
SET TUBE SMOKE EVAC HIGH FLOW (TUBING) ×2 IMPLANT
SLEEVE XCEL OPT CAN 5 100 (ENDOMECHANICALS) ×2 IMPLANT
STRIP CLOSURE SKIN 1/2X4 (GAUZE/BANDAGES/DRESSINGS) IMPLANT
SUT MNCRL AB 4-0 PS2 18 (SUTURE) ×2 IMPLANT
SUT VIC AB 0 BRD 54 (SUTURE) ×1 IMPLANT
SYR 20ML LL LF (SYRINGE) ×2 IMPLANT
TOWEL OR 17X26 10 PK STRL BLUE (TOWEL DISPOSABLE) ×2 IMPLANT
TRAY LAPAROSCOPIC (CUSTOM PROCEDURE TRAY) ×2 IMPLANT
TROCAR BLADELESS OPT 5 100 (ENDOMECHANICALS) ×2 IMPLANT
TROCAR XCEL BLUNT TIP 100MML (ENDOMECHANICALS) IMPLANT
TROCAR XCEL NON-BLD 11X100MML (ENDOMECHANICALS) ×2 IMPLANT

## 2019-10-09 NOTE — Anesthesia Procedure Notes (Signed)
Date/Time: 10/09/2019 12:20 PM Performed by: Minerva Ends, CRNA Oxygen Delivery Method: Simple face mask Placement Confirmation: positive ETCO2 and breath sounds checked- equal and bilateral Dental Injury: Teeth and Oropharynx as per pre-operative assessment

## 2019-10-09 NOTE — Transfer of Care (Signed)
Immediate Anesthesia Transfer of Care Note  Patient: Colleen Hoffman  Procedure(s) Performed: LAPAROSCOPIC CHOLECYSTECTOMY WITH INTRAOPERATIVE CHOLANGIOGRAM AND HERNIA REPAIR (N/A Abdomen)  Patient Location: PACU  Anesthesia Type:General  Level of Consciousness: awake and alert   Airway & Oxygen Therapy: Patient Spontanous Breathing and Patient connected to face mask oxygen  Post-op Assessment: Report given to RN and Post -op Vital signs reviewed and stable  Post vital signs: Reviewed and stable  Last Vitals:  Vitals Value Taken Time  BP    Temp    Pulse    Resp    SpO2      Last Pain:  Vitals:   10/09/19 0926  TempSrc:   PainSc: 5       Patients Stated Pain Goal: 5 (10/09/19 0840)  Complications: No apparent anesthesia complications

## 2019-10-09 NOTE — Anesthesia Postprocedure Evaluation (Signed)
Anesthesia Post Note  Patient: CHELCY BOLDA  Procedure(s) Performed: LAPAROSCOPIC CHOLECYSTECTOMY WITH INTRAOPERATIVE CHOLANGIOGRAM AND HERNIA REPAIR (N/A Abdomen)     Patient location during evaluation: PACU Anesthesia Type: General Level of consciousness: awake and alert Pain management: pain level controlled Vital Signs Assessment: post-procedure vital signs reviewed and stable Respiratory status: spontaneous breathing, nonlabored ventilation and respiratory function stable Cardiovascular status: blood pressure returned to baseline and stable Postop Assessment: no apparent nausea or vomiting Anesthetic complications: no    Last Vitals:  Vitals:   10/09/19 1315 10/09/19 1429  BP: 130/84 122/75  Pulse: 78 83  Resp: 14 16  Temp:  37.1 C  SpO2: 100% 94%    Last Pain:  Vitals:   10/09/19 1429  TempSrc: Oral  PainSc:                  Lucretia Kern

## 2019-10-09 NOTE — Anesthesia Preprocedure Evaluation (Signed)
Anesthesia Evaluation  Patient identified by MRN, date of birth, ID band Patient awake    Reviewed: Allergy & Precautions, NPO status , Patient's Chart, lab work & pertinent test results  History of Anesthesia Complications Negative for: history of anesthetic complications  Airway Mallampati: II  TM Distance: >3 FB Neck ROM: Full    Dental  (+) Teeth Intact   Pulmonary neg pulmonary ROS,    Pulmonary exam normal        Cardiovascular negative cardio ROS Normal cardiovascular exam     Neuro/Psych PSYCHIATRIC DISORDERS Anxiety Depression negative neurological ROS     GI/Hepatic Neg liver ROS, Acute cholecystitis   Endo/Other  negative endocrine ROS  Renal/GU negative Renal ROS  negative genitourinary   Musculoskeletal Chronic pain/CRPS on lyrica, s/p SCS   Abdominal   Peds  Hematology negative hematology ROS (+)   Anesthesia Other Findings Recent COVID-19 infection (positive 09/19/18)  Reproductive/Obstetrics                             Anesthesia Physical Anesthesia Plan  ASA: II  Anesthesia Plan: General   Post-op Pain Management:    Induction: Intravenous and Rapid sequence  PONV Risk Score and Plan: 3 and Ondansetron, Dexamethasone, Treatment may vary due to age or medical condition and Midazolam  Airway Management Planned: Oral ETT  Additional Equipment: None  Intra-op Plan:   Post-operative Plan: Extubation in OR  Informed Consent: I have reviewed the patients History and Physical, chart, labs and discussed the procedure including the risks, benefits and alternatives for the proposed anesthesia with the patient or authorized representative who has indicated his/her understanding and acceptance.     Dental advisory given  Plan Discussed with:   Anesthesia Plan Comments:         Anesthesia Quick Evaluation

## 2019-10-09 NOTE — Anesthesia Procedure Notes (Signed)
Procedure Name: Intubation Date/Time: 10/09/2019 10:16 AM Performed by: Minerva Ends, CRNA Pre-anesthesia Checklist: Patient identified, Emergency Drugs available, Suction available and Patient being monitored Patient Re-evaluated:Patient Re-evaluated prior to induction Oxygen Delivery Method: Circle System Utilized Preoxygenation: Pre-oxygenation with 100% oxygen Induction Type: IV induction Ventilation: Mask ventilation without difficulty Laryngoscope Size: Miller and 2 Grade View: Grade I Tube type: Oral Tube size: 7.0 mm Number of attempts: 1 Airway Equipment and Method: Stylet and Oral airway Placement Confirmation: ETT inserted through vocal cords under direct vision,  positive ETCO2 and breath sounds checked- equal and bilateral Secured at: 22 cm Tube secured with: Tape Dental Injury: Teeth and Oropharynx as per pre-operative assessment  Comments: Smooth IV induction-- intubation AM CRNA atraumatic-- teeth and mouth as preop-- bilat BS Darlin Drop

## 2019-10-09 NOTE — Progress Notes (Signed)
PROGRESS NOTE  ANGLES TREVIZO ZOX:[id number] DOB: 06/08/1976 DOA: 10/07/2019 PCP: Patient, No Pcp Per   LOS: 2 days   Brief narrative: As per HPI,  This is a 44 year old female with history of complex regional pain syndrome with spinal cord stimulator, anxiety, depression, abdominal hysterectomy, COVID-19 positive on 1/23 who came to ED on 2/9 with acute onset sharp chest pain and radiation between scapulas and down the back with associated shortness of breath, nausea and vomiting. Patient had negative CT dissection study for acute aortic syndrome, negative troponins x2. She was found to have positive RUQ ultrasound with 19 mm calculus in fixed position in the gallbladder neck with minimal gallbladder thickening with positive Murphy sign.  She was evaluated by general surgery initially planned on taking patient for surgery however she had repeat Covid testing which came back positive and surgery recommended medical management. Patient persisted to have significant abdominal pain and was subsequently decided for surgical intervention.  Assessment/Plan:  Active Problems:   Acute cholecystitis   Cholecystitis   History of COVID-19  Symptomatic cholelithiasis with acute calculus cholecystitis.  Patient had right upper quadrant ultrasound which showed 19 mm gallstone in the gallbladder neck with cholecystitis.  Was empirically started on ciprofloxacin.  .  Currently on Dilaudid Toradol and Lyrica.  Patient persists to have significant abdominal pain and vomiting so we will undergo surgical intervention today with cholecystectomy.  Covid positive on 1/23. No hypoxia.  Mild change in the smell and taste but otherwise no infection.  Seen by infectious disease and recommended discontinuation of isolation.    Anxiety.  Continue Ativan  Complex regional pain syndrome with spinal cord stimulator  On IV pain medication at this time.  VTE Prophylaxis: Lovenox subcu  Code Status: Full  code  Family Communication: None  Disposition Plan:  . Patient is from home . Likely disposition to home in 1 to 2 days . Barriers to discharge: Awaiting for cholecystectomy today  Procedures:  None  Antibiotics:  . Ciprofloxacin  Anti-infectives (From admission, onward)   Start     Dose/Rate Route Frequency Ordered Stop   10/07/19 2200  [MAR Hold]  ciprofloxacin (CIPRO) IVPB 400 mg     (MAR Hold since Thu 10/09/2019 at 0918.Hold Reason: Transfer to a Procedural area.)   400 mg 200 mL/hr over 60 Minutes Intravenous Every 12 hours 10/07/19 1149     10/07/19 1015  ciprofloxacin (CIPRO) IVPB 400 mg     400 mg 200 mL/hr over 60 Minutes Intravenous  Once 10/07/19 1007 10/07/19 1152     Subjective: Today, patient was seen and examined at bedside.  Patient complains of severe right upper quadrant pain with nausea and vomiting.  Awaiting for surgical intervention  Objective: Vitals:   10/09/19 0513 10/09/19 0918  BP: (!) 114/59 108/70  Pulse: 63 76  Resp:  16  Temp: 98.2 F (36.8 C) 98.5 F (36.9 C)  SpO2: 92% 94%    Intake/Output Summary (Last 24 hours) at 10/09/2019 8119 Last data filed at 10/09/2019 0600 Gross per 24 hour  Intake 4407.59 ml  Output --  Net 4407.59 ml   Filed Weights   10/07/19 0350 10/07/19 1852  Weight: 87.1 kg 99.8 kg   Body mass index is 35.51 kg/m.   Physical Exam: GENERAL: Patient is alert awake and oriented, in mild distress due to pain.  Obese HENT: No scleral pallor or icterus. Pupils equally reactive to light. Oral mucosa is moist NECK: is supple, no gross swelling noted.  CHEST: Clear to auscultation. No crackles or wheezes.  Diminished breath sounds bilaterally. CVS: S1 and S2 heard, no murmur. Regular rate and rhythm.  ABDOMEN: Soft, but right upper quadrant tenderness, bowel sounds are present. EXTREMITIES: No edema. CNS: Cranial nerves are intact. No focal motor deficits.   SKIN: warm and dry without rashes.  Data Review: I have  personally reviewed the following laboratory data and studies,  CBC: Recent Labs  Lab 10/07/19 0504 10/08/19 0312 10/09/19 0245  WBC 9.2 5.8 5.5  HGB 12.2 12.4 12.5  HCT 37.0 38.4 39.2  MCV 99.5 100.8* 102.9*  PLT 307 283 979   Basic Metabolic Panel: Recent Labs  Lab 10/07/19 0444 10/08/19 0312 10/09/19 0245  NA 139 138 138  K 4.0 3.5 3.8  CL 109 111 112*  CO2 23 21* 21*  GLUCOSE 113* 108* 103*  BUN 15 6 <5*  CREATININE 0.65 0.52 0.50  CALCIUM 8.7* 7.8* 7.9*  MG  --   --  2.1   Liver Function Tests: Recent Labs  Lab 10/07/19 0551 10/08/19 0312 10/09/19 0245  AST 21 52* 26  ALT 20 63* 45*  ALKPHOS 57 100 75  BILITOT 1.1 1.1 1.1  PROT 6.5 6.0* 5.8*  ALBUMIN 3.5 3.2* 3.1*   Recent Labs  Lab 10/07/19 0551  LIPASE 24   No results for input(s): AMMONIA in the last 168 hours. Cardiac Enzymes: No results for input(s): CKTOTAL, CKMB, CKMBINDEX, TROPONINI in the last 168 hours. BNP (last 3 results) No results for input(s): BNP in the last 8760 hours.  ProBNP (last 3 results) No results for input(s): PROBNP in the last 8760 hours.  CBG: No results for input(s): GLUCAP in the last 168 hours. Recent Results (from the past 240 hour(s))  Respiratory Panel by RT PCR (Flu A&B, Covid) - Nasopharyngeal Swab     Status: Abnormal   Collection Time: 10/07/19 10:01 AM   Specimen: Nasopharyngeal Swab  Result Value Ref Range Status   SARS Coronavirus 2 by RT PCR POSITIVE (A) NEGATIVE Final    Comment: RESULT CALLED TO, READ BACK BY AND VERIFIED WITH: MCIVER,M. RN @1440  ON 02.09.2021 BY COHEN,K    Influenza A by PCR NEGATIVE NEGATIVE Final   Influenza B by PCR NEGATIVE NEGATIVE Final    Comment: Performed at [facility] [place] [address] [place], [place] [address]     Studies: No results found.    Flora Lipps, MD  [facility] 10/09/2019

## 2019-10-09 NOTE — Op Note (Signed)
Colleen Hoffman  Primary Care Physician:  Patient, No Pcp Per    10/09/2019  12:17 PM  Procedure: Laparoscopic Cholecystectomy with intraoperative cholangiogram  Surgeon: Susy Frizzle B. Daphine Deutscher, MD, FACS Asst:  Angelena Form, MD, FACS  Anes:  General  Drains:  None  Findings: Cholecystitis, umbilical hernia containing fat  Description of Procedure: The patient was taken to OR 1 and given general anesthesia.  The patient was prepped with chloroprep and draped sterilely. A time out was performed.  Access to the abdomen was achieved with a 5 mm Optiview through the right upper quadrant.  Port placement included three 5 mm and a 11 in the upper midline.  The umbilical trocar was passed through an umbilical hernia containing fat and we had discussed repairing that at the end of the case.  .    The gallbladder was visualized and the fundus was grasped and the gallbladder was elevated. Traction on the infundibulum allowed for successful demonstration of the critical view. Inflammatory changes were acute.  The cystic duct was identified and clipped up on the gallbladder and an incision was made in the cystic duct and the Reddick catheter was inserted after milking the cystic duct of any debris. A dynamic cholangiogram was performed which demonstrated good intrahepatic filling and free flow into the duodenum.    The cystic duct was then triple clipped and divided, the cystic artery was double clipped and divided and then the gallbladder was removed from the gallbladder bed. Removal of the gallbladder from the gallbladder bed was performed with clips on a posterior vessel midway up the gallbladder bed.  The gallbladder was then placed in a bag and brought out through one of the trocar sites. The gallbladder bed was inspected and no bleeding or bile leaks were seen.   Laparoscopic visualization was used when closing fascial defects for the umbilical trocar.  The prolapsing fat was removed and the fascial defect  was closed with three sutures of 0 vicryl, two of them closed with PMI. The inferior skin was tacked to the fascia to recreate an "innie".    Incisions were injected with Exparel and closed with 4-0 Monocryl and Dermabond on the skin.  Sponge and needle count were correct.    The patient was taken to the recovery room in satisfactory condition.

## 2019-10-09 NOTE — Progress Notes (Signed)
Plan of care and diet orders reviewed with patient and husband. Verbalized understanding. Pt has been up ambulating in room and has voided since surgery. Is tolerating clear liquids thus far.Melton Alar, RN

## 2019-10-09 NOTE — Interval H&P Note (Signed)
History and Physical Interval Note:  10/09/2019 9:44 AM  Colleen Hoffman  has presented today for surgery, with the diagnosis of SYMPTOMATIC CHOLECYSTITIS.  The various methods of treatment have been discussed with the patient and family. After consideration of risks, benefits and other options for treatment, the patient has consented to  Procedure(s): LAPAROSCOPIC CHOLECYSTECTOMY WITH INTRAOPERATIVE CHOLANGIOGRAM (N/A) as a surgical intervention.  The patient's history has been reviewed, patient examined, no change in status, stable for surgery.  I have reviewed the patient's chart and labs.  Questions were answered to the patient's satisfaction.     [name]

## 2019-10-10 LAB — SURGICAL PATHOLOGY

## 2019-10-10 MED ORDER — DOCUSATE SODIUM 100 MG PO CAPS: 100.0000 mg | ORAL_CAPSULE | Freq: Every day | ORAL | 0 refills | Status: AC | PRN

## 2019-10-10 MED ORDER — TRAMADOL HCL 50 MG PO TABS: 50.0000 mg | ORAL_TABLET | Freq: Four times a day (QID) | ORAL | 0 refills | Status: AC | PRN

## 2019-10-10 MED ORDER — PREGABALIN 100 MG PO CAPS: 100.0000 mg | ORAL_CAPSULE | Freq: Every day | ORAL | 0 refills | Status: AC | PRN

## 2019-10-10 MED ORDER — BACLOFEN 10 MG PO TABS: 10.0000 mg | ORAL_TABLET | Freq: Three times a day (TID) | ORAL | 0 refills | Status: AC

## 2019-10-10 MED ORDER — ONDANSETRON 4 MG PO TBDP: 4.0000 mg | ORAL_TABLET | Freq: Four times a day (QID) | ORAL | 0 refills | Status: AC | PRN

## 2019-10-10 MED ORDER — PANTOPRAZOLE SODIUM 40 MG PO TBEC: 40.0000 mg | DELAYED_RELEASE_TABLET | Freq: Every day | ORAL | 0 refills | Status: AC

## 2019-10-10 MED ORDER — BACLOFEN 10 MG PO TABS: 10.0000 mg | ORAL_TABLET | Freq: Three times a day (TID) | ORAL | 0 refills | Status: DC

## 2019-10-10 NOTE — Progress Notes (Signed)
AVS given to patient and explained at the bedside. Medications and follow up appointments have been explained with pt verbalizing understanding.  

## 2019-10-10 NOTE — Discharge Summary (Signed)
Physician Discharge Summary  Colleen Hoffman UXL:[id number] DOB: 10/20/1975 DOA: 10/07/2019  PCP: Patient, No Pcp Per  Admit date: 10/07/2019 Discharge date: 10/10/2019  Admitted From: Home  Discharge disposition: Home   Recommendations for Outpatient Follow-Up:   . Follow up with your primary care provider in one week.  . Check CBC, BMP at that time. . Follow-up with the [facility] surgery on 10/30/2019 at 2:30 PM. . Follow-up with pain clinic as scheduled by you.  Discharge Diagnosis:   Active Problems:   Acute cholecystitis   Cholecystitis   History of COVID-19    Discharge Condition: Improved.  Diet recommendation: Low sodium, heart healthy.    Wound care: Laparoscopic scar site care  Code status: Full.   History of Present Illness:   This is a 44 year old female with history of complex regional pain syndrome with spinal cord stimulator, anxiety, depression, abdominal hysterectomy, COVID-19 positive on 1/23 who came to ED on 2/9 with acute onset sharp chest pain and radiation between scapulas and down the back with associated shortness of breath, nausea and vomiting. Patient had negative CT dissection study for acute aortic syndrome, negative troponins x2. She was found to have positive RUQ ultrasound with 19 mm calculus in fixed position in the gallbladder neck with minimal gallbladder thickening with positive Murphy sign. She was evaluated by general surgery initially planned on taking patient for surgery however she had repeat Covid testing which came back positive and surgery recommended medical management. Patient persisted to have significant abdominal pain and was subsequently decided for surgical intervention.   Hospital Course:   Following conditions were addressed during hospitalization as listed below,  Symptomatic cholelithiasis with acute calculus cholecystitis.  Patient had right upper quadrant ultrasound which showed 19 mm gallstone in the  gallbladder neck with cholecystitis.  Was empirically started on ciprofloxacin.  .  Patient underwent a laparoscopic cholecystectomy on 08/07/2020 with significant improvement in her symptoms.  Has been started on oral diet which she has tolerated.  Surgery has seen the patient today and patient is stable for disposition home.  Covid positive on 1/23. No hypoxia.  Mild change in the smell and taste but otherwise no infection.  Seen by infectious disease and recommended discontinuation of isolation.    No treatment planned as outpatient.  Complex regional pain syndrome with spinal cord stimulator Patient will resume her Lyrica at home.  She will also be on baclofen and tramadol on discharge for pain.  Disposition.  At this time, patient is stable for disposition home.  Medical Consultants:    General surgery  Procedures:    Laparoscopic cholecystectomy on 10/09/2019  Subjective:   Today, patient feels much better with pain.  Has tolerated diet.  Has passed gas but no bowel movements.  Denies any shortness of breath, nausea, vomiting or fever  Discharge Exam:   Vitals:   10/09/19 2029 10/10/19 0616  BP: 132/73 111/67  Pulse: 100 78  Resp: 20 20  Temp: 98.8 F (37.1 C) 98.7 F (37.1 C)  SpO2: 96% 94%   Vitals:   10/09/19 1315 10/09/19 1429 10/09/19 2029 10/10/19 0616  BP: 130/84 122/75 132/73 111/67  Pulse: 78 83 100 78  Resp: 14 16 20 20   Temp:  98.7 F (37.1 C) 98.8 F (37.1 C) 98.7 F (37.1 C)  TempSrc:  Oral Oral Oral  SpO2: 100% 94% 96% 94%  Weight:      Height:       Body mass index is 35.51  kg/m. General: Alert awake, not in obvious distress, obese HENT: pupils equally reacting to light,  No scleral pallor or icterus noted. Oral mucosa is moist.  Chest:  Clear breath sounds.  Diminished breath sounds bilaterally. No crackles or wheezes.  CVS: S1 &S2 heard. No murmur.  Regular rate and rhythm. Abdomen: Soft, nontender, nondistended.  Bowel sounds are heard.   Laparoscopic scar in place. Extremities: No cyanosis, clubbing or edema.  Peripheral pulses are palpable. Psych: Alert, awake and oriented, normal mood CNS:  No cranial nerve deficits.  Power equal in all extremities.   Skin: Warm and dry.  No rashes noted.  The results of significant diagnostics from this hospitalization (including imaging, microbiology, ancillary and laboratory) are listed below for reference.     Diagnostic Studies:   DG Chest 2 View  Result Date: 10/07/2019 CLINICAL DATA:  Chest pain EXAM: CHEST - 2 VIEW COMPARISON:  None. FINDINGS: Normal heart size and mediastinal contours. No acute infiltrate or edema. No effusion or pneumothorax. Mild thoracic and lumbar levocurvature. No acute osseous findings. IMPRESSION: No active cardiopulmonary disease. Electronically Signed   By: Marnee Spring M.D.   On: 10/07/2019 04:18   CT Angio Chest/Abd/Pel for Dissection W and/or Wo Contrast  Result Date: 10/07/2019 CLINICAL DATA:  Chest pain or back pain. Aortic dissection suspected EXAM: CT ANGIOGRAPHY CHEST, ABDOMEN AND PELVIS TECHNIQUE: Multidetector CT imaging through the chest, abdomen and pelvis was performed using the standard protocol during bolus administration of intravenous contrast. Multiplanar reconstructed images and MIPs were obtained and reviewed to evaluate the vascular anatomy. CONTRAST:  OMNIPAQUE IOHEXOL 350 MG/ML SOLN COMPARISON:  None. FINDINGS: CTA CHEST FINDINGS Cardiovascular: No intramural hematoma on noncontrast phase. No aortic dissection, aneurysm, or periaortic inflammation. Mediastinum/Nodes: Negative for adenopathy the, soft G ule thickening, or inflammatory changes Lungs/Pleura: Central airways are clear. There is no edema, consolidation, effusion, or pneumothorax. Musculoskeletal: Negative Review of the MIP images confirms the above findings. CTA ABDOMEN AND PELVIS FINDINGS VASCULAR Aorta: Normal caliber aorta without aneurysm, dissection, vasculitis or  significant stenosis. Celiac: Vessels are smoothly contoured and non atheromatous. Median arcuate ligament impression. 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 No stenosis or beading IMA: Patent Inflow: Unremarkable Veins: Negative in the arterial phase Review of the MIP images confirms the above findings. NON-VASCULAR Hepatobiliary: No focal liver abnormality.Small calcified gallstone. No pericholecystic edema, no definite wall thickening. Pancreas: Unremarkable. Spleen: Unremarkable. Adrenals/Urinary Tract: Negative adrenals. No hydronephrosis or stone. Unremarkable bladder. Stomach/Bowel: No obstruction. No appendicitis. Formed stool in multiple colonic segments without rectal impaction or over distention. Lymphatic: No mass or adenopathy. Reproductive:Hysterectomy. Other: No ascites or pneumoperitoneum. Musculoskeletal: No acute abnormalities. Lumbosacral neural stimulator. Mild thoracic and lumbar scoliosis. Review of the MIP images confirms the above findings. IMPRESSION: 1. Negative acute aortic syndrome. 2. Cholelithiasis. Electronically Signed   By: Marnee Spring M.D.   On: 10/07/2019 06:58   US Abdomen Limited RUQ  Result Date: 10/07/2019 CLINICAL DATA:  Epigastric and chest pain EXAM: ULTRASOUND ABDOMEN LIMITED RIGHT UPPER QUADRANT COMPARISON:  None FINDINGS: Gallbladder: Shadowing calculus 19 mm diameter fixed in position at the gallbladder neck. Minimal gallbladder wall thickening. Sonographic Murphy sign present. No definite pericholecystic fluid. Findings suspicious for early acute cholecystitis. Common bile duct: Diameter: 3 mm, normal Liver: Normal parenchymal echogenicity. No definite mass or nodularity. Portal vein is patent on color Doppler imaging with normal direction of blood flow towards the liver. Other: No RIGHT upper quadrant free  fluid. IMPRESSION: 19 mm diameter gallstone fixed in position at gallbladder neck with minimal  gallbladder wall thickening and sonographic Murphy sign suspicious for early acute cholecystitis. No biliary dilatation or focal hepatic abnormality. Electronically Signed   By: Lavonia Dana M.D.   On: 10/07/2019 08:16     Labs:   Basic Metabolic Panel: Recent Labs  Lab 10/07/19 0444 10/07/19 0444 10/08/19 0312 10/09/19 0245  NA 139  --  138 138  K 4.0   < > 3.5 3.8  CL 109  --  111 112*  CO2 23  --  21* 21*  GLUCOSE 113*  --  108* 103*  BUN 15  --  6 <5*  CREATININE 0.65  --  0.52 0.50  CALCIUM 8.7*  --  7.8* 7.9*  MG  --   --   --  2.1   < > = values in this interval not displayed.   GFR Estimated Creatinine Clearance: 110.3 mL/min (by C-G formula based on SCr of 0.5 mg/dL). Liver Function Tests: Recent Labs  Lab 10/07/19 0551 10/08/19 0312 10/09/19 0245  AST 21 52* 26  ALT 20 63* 45*  ALKPHOS 57 100 75  BILITOT 1.1 1.1 1.1  PROT 6.5 6.0* 5.8*  ALBUMIN 3.5 3.2* 3.1*   Recent Labs  Lab 10/07/19 0551  LIPASE 24   No results for input(s): AMMONIA in the last 168 hours. Coagulation profile No results for input(s): INR, PROTIME in the last 168 hours.  CBC: Recent Labs  Lab 10/07/19 0504 10/08/19 0312 10/09/19 0245  WBC 9.2 5.8 5.5  HGB 12.2 12.4 12.5  HCT 37.0 38.4 39.2  MCV 99.5 100.8* 102.9*  PLT 307 283 294   Cardiac Enzymes: No results for input(s): CKTOTAL, CKMB, CKMBINDEX, TROPONINI in the last 168 hours. BNP: Invalid input(s): POCBNP CBG: No results for input(s): GLUCAP in the last 168 hours. D-Dimer No results for input(s): DDIMER in the last 72 hours. Hgb A1c No results for input(s): HGBA1C in the last 72 hours. Lipid Profile No results for input(s): CHOL, HDL, LDLCALC, TRIG, CHOLHDL, LDLDIRECT in the last 72 hours. Thyroid function studies No results for input(s): TSH, T4TOTAL, T3FREE, THYROIDAB in the last 72 hours.  Invalid input(s): FREET3 Anemia work up No results for input(s): VITAMINB12, FOLATE, FERRITIN, TIBC, IRON, RETICCTPCT  in the last 72 hours. Microbiology Recent Results (from the past 240 hour(s))  Respiratory Panel by RT PCR (Flu A&B, Covid) - Nasopharyngeal Swab     Status: Abnormal   Collection Time: 10/07/19 10:01 AM   Specimen: Nasopharyngeal Swab  Result Value Ref Range Status   SARS Coronavirus 2 by RT PCR POSITIVE (A) NEGATIVE Final    Comment: RESULT CALLED TO, READ BACK BY AND VERIFIED WITH: MCIVER,M. RN @1440  ON 02.09.2021 BY COHEN,K    Influenza A by PCR NEGATIVE NEGATIVE Final   Influenza B by PCR NEGATIVE NEGATIVE Final    Comment: Performed at [facility], [place] [address] [place], [place] [address]  MRSA PCR Screening     Status: None   Collection Time: 10/09/19  9:10 AM   Specimen: Nasopharyngeal  Result Value Ref Range Status   MRSA by PCR NEGATIVE NEGATIVE Final    Comment:        The GeneXpert MRSA Assay (FDA approved for NASAL specimens only), is one component of a comprehensive MRSA colonization surveillance program. It is not intended to diagnose MRSA infection nor to guide or monitor treatment for MRSA infections. Performed at  [facility], [address], [place], [place] [address]      Discharge Instructions:   Discharge Instructions    Diet - low sodium heart healthy   Complete by: As directed    Discharge instructions   Complete by: As directed    Follow-up with general surgery as has been scheduled.  Follow-up with your primary care physician in 1 to 2 weeks.   Increase activity slowly   Complete by: As directed      Allergies as of 10/10/2019      Reactions   Penicillins Anaphylaxis, Shortness Of Breath   Turns blue Has patient had a PCN reaction causing immediate rash, facial/tongue/throat swelling, SOB or lightheadedness with hypotension: No Has patient had a PCN reaction causing severe rash involving mucus membranes or skin necrosis: No Has patient had a PCN reaction that required hospitalization No Has patient  had a PCN reaction occurring within the last 10 years: No If all of the above answers are "NO", then may proceed with Cephalosporin use.   Codeine Hives, Other (See Comments)   Migraine   Garlic Hives   Leek [allium Porrum] Hives   Onion Hives   Oxycodone Hives   Percocet [oxycodone-acetaminophen] Hives   Sucrase Other (See Comments)   Doesn't digest properly   Sucrose Other (See Comments)   Doesn't digest properly      Medication List    STOP taking these medications   HYDROcodone-acetaminophen 5-325 MG tablet Commonly known as: NORCO/VICODIN     TAKE these medications   baclofen 10 MG tablet Commonly known as: LIORESAL Take 1 tablet (10 mg total) by mouth 3 (three) times daily.   docusate sodium 100 MG capsule Commonly known as: Colace Take 1 capsule (100 mg total) by mouth daily as needed for mild constipation or moderate constipation.   ondansetron 4 MG disintegrating tablet Commonly known as: ZOFRAN-ODT Take 1 tablet (4 mg total) by mouth every 6 (six) hours as needed for nausea.   pantoprazole 40 MG tablet Commonly known as: Protonix Take 1 tablet (40 mg total) by mouth daily.   pregabalin 100 MG capsule Commonly known as: LYRICA Take 1 capsule (100 mg total) by mouth daily as needed (pain).   traMADol 50 MG tablet Commonly known as: ULTRAM Take 1 tablet (50 mg total) by mouth every 6 (six) hours as needed (breakthrough pain).      Follow-up Information    Surgery, [facility]. Go on 10/30/2019.   Specialty: General Surgery Why: 230pm. This will be a telephone visit. The PA will call you at the time of the visit.  Contact information: [address] [place] [address] [phone]        Primary care provider. Schedule an appointment as soon as possible for a visit in 1 week(s).   Why: regular follow up           Time coordinating discharge: 39 minutes  Signed:  Lilyann Gravelle  [facility] 10/10/2019, 10:30  AM

## 2019-10-10 NOTE — Progress Notes (Signed)
1 Day Post-Op  Subjective: CC: Abdominal pain Patient reports pain has greatly improved since surgery. Some soreness around laparoscopic incisions and in RUQ. She feels her abdomen is softer. She is tolerating her diet without n/v. She has ambulated in the room several times. She has voided without difficulty. She is still requiring IV pain medication.   Objective: Vital signs in last 24 hours: Temp:  [98.1 F (36.7 C)-98.8 F (37.1 C)] 98.7 F (37.1 C) (02/12 0616) Pulse Rate:  [78-100] 78 (02/12 0616) Resp:  [10-20] 20 (02/12 0616) BP: (111-132)/(67-84) 111/67 (02/12 0616) SpO2:  [94 %-100 %] 94 % (02/12 0616) Last BM Date: 10/07/19  Intake/Output from previous day: 02/11 0701 - 02/12 0700 In: 1500 [I.V.:1500] Out: 25 [Blood:25] Intake/Output this shift: No intake/output data recorded.  PE: Gen:  Alert, NAD, pleasant Card:  RRR, no M/G/R heard Pulm:  CTAB, no W/R/R, effort normal Abd: Soft, ND, appropriately tender around laparoscopic incisions, +BS, incisions with glue intact appears well and are without drainage, bleeding, or signs of infection.  Ext:  No LE edema  Psych: A&Ox3  Skin: no rashes noted, warm and dry  Lab Results:  Recent Labs    10/08/19 0312 10/09/19 0245  WBC 5.8 5.5  HGB 12.4 12.5  HCT 38.4 39.2  PLT 283 294   BMET Recent Labs    10/08/19 0312 10/09/19 0245  NA 138 138  K 3.5 3.8  CL 111 112*  CO2 21* 21*  GLUCOSE 108* 103*  BUN 6 <5*  CREATININE 0.52 0.50  CALCIUM 7.8* 7.9*   PT/INR No results for input(s): LABPROT, INR in the last 72 hours. CMP     Component Value Date/Time   NA 138 10/09/2019 0245   K 3.8 10/09/2019 0245   CL 112 (H) 10/09/2019 0245   CO2 21 (L) 10/09/2019 0245   GLUCOSE 103 (H) 10/09/2019 0245   BUN <5 (L) 10/09/2019 0245   CREATININE 0.50 10/09/2019 0245   CALCIUM 7.9 (L) 10/09/2019 0245   PROT 5.8 (L) 10/09/2019 0245   ALBUMIN 3.1 (L) 10/09/2019 0245   AST 26 10/09/2019 0245   ALT 45 (H)  10/09/2019 0245   ALKPHOS 75 10/09/2019 0245   BILITOT 1.1 10/09/2019 0245   GFRNONAA >60 10/09/2019 0245   GFRAA >60 10/09/2019 0245   Lipase     Component Value Date/Time   LIPASE 24 10/07/2019 0551       Studies/Results: DG Cholangiogram Operative  Result Date: 10/09/2019 CLINICAL DATA:  Intraoperative cholangiogram during laparoscopic cholecystectomy. EXAM: INTRAOPERATIVE CHOLANGIOGRAM FLUOROSCOPY TIME:  15 seconds COMPARISON:  Right upper quadrant abdominal ultrasound-10/07/2019 FINDINGS: Intraoperative cholangiographic images of the right upper abdominal quadrant during laparoscopic cholecystectomy are provided for review. Surgical clips overlie the expected location of the gallbladder fossa. Contrast injection demonstrates selective cannulation of the central aspect of the cystic duct. There is passage of contrast through the central aspect of the cystic duct with filling of a non dilated common bile duct. There is passage of contrast though the CBD and into the descending portion of the duodenum. There is minimal reflux of injected contrast into the common hepatic duct and central aspect of the non dilated intrahepatic biliary system. There are no discrete filling defects within the opacified portions of the biliary system to suggest the presence of choledocholithiasis. IMPRESSION: No evidence of choledocholithiasis. Electronically Signed   By: Sandi Mariscal M.D.   On: 10/09/2019 12:28    Anti-infectives: Anti-infectives (From admission, onward)  Start     Dose/Rate Route Frequency Ordered Stop   10/07/19 2200  ciprofloxacin (CIPRO) IVPB 400 mg     400 mg 200 mL/hr over 60 Minutes Intravenous Every 12 hours 10/07/19 1149     10/07/19 1015  ciprofloxacin (CIPRO) IVPB 400 mg     400 mg 200 mL/hr over 60 Minutes Intravenous  Once 10/07/19 1007 10/07/19 1152       Assessment/Plan COVID+ -Quarantinedon 1/16. Positive test on 1/23 and 2/9. Appreciate TRH and ID's help  Acute  cholecystitis Umbilical hernia containing fat - s/p Laparoscopic Cholecystectomy with IOC and closure of umbilical hernia - Dr. Daphine Deutscher - 10/09/2019. POD #1 - On POD 1, the patient is voiding well, tolerating a diet, ambulating well, vital signs stable, and incisions c/d/i. She did take some IV pain medication this am. If she tolerates pain with oral medications this morning, she is felt stable for discharge home.  FEN: Reg VTE: SCDs, Lovenox  ID: Cipro 2/9 >> (no abx needed at time of d/c. Can cont IV while in the hospital) Foley: None Follow-Up: DOW 3/4   LOS: 3 days    Jacinto Halim , [facility] 10/10/2019, 9:52 AM Please see Amion for pager number during day hours 7:00am-4:30pm

## 2019-10-10 NOTE — Discharge Instructions (Signed)
[facility] ° °Please arrive at least 30 min before your appointment to complete your check in paperwork.  If you are unable to arrive 30 min prior to your appointment time we may have to cancel or reschedule you. °LAPAROSCOPIC SURGERY: POST OP INSTRUCTIONS °Always review your discharge instruction sheet given to you by the facility where your surgery was performed. °IF YOU HAVE DISABILITY OR FAMILY LEAVE FORMS, YOU MUST BRING THEM TO THE OFFICE FOR PROCESSING.   °DO NOT GIVE THEM TO YOUR DOCTOR. ° °PAIN CONTROL ° °1. First take acetaminophen (Tylenol) AND/or ibuprofen (Advil) to control your pain after surgery.  Follow directions on package.  Taking acetaminophen (Tylenol) and/or ibuprofen (Advil) regularly after surgery will help to control your pain and lower the amount of prescription pain medication you may need.  You should not take more than 4,000 mg (4 grams) of acetaminophen (Tylenol) in 24 hours.  You should not take ibuprofen (Advil), aleve, motrin, naprosyn or other NSAIDS if you have a history of stomach ulcers or chronic kidney disease.  °2. A prescription for pain medication may be given to you upon discharge.  Take your pain medication as prescribed, if you still have uncontrolled pain after taking acetaminophen (Tylenol) or ibuprofen (Advil). °3. Use ice packs to help control pain. °4. If you need a refill on your pain medication, please contact your pharmacy.  They will contact our office to request authorization. Prescriptions will not be filled after 5pm or on week-ends. ° °HOME MEDICATIONS °5. Take your usually prescribed medications unless otherwise directed. ° °DIET °6. You should follow a light diet the first few days after arrival home.  Be sure to include lots of fluids daily. Avoid fatty, fried foods.  ° °CONSTIPATION °7. It is common to experience some constipation after surgery and if you are taking pain medication.  Increasing fluid intake and taking a stool  softener (such as Colace) will usually help or prevent this problem from occurring.  A mild laxative (Milk of Magnesia or Miralax) should be taken according to package instructions if there are no bowel movements after 48 hours. ° °WOUND/INCISION CARE °8. Most patients will experience some swelling and bruising in the area of the incisions.  Ice packs will help.  Swelling and bruising can take several days to resolve.  °9. Unless discharge instructions indicate otherwise, follow guidelines below  °a. STERI-STRIPS - you may remove your outer bandages 48 hours after surgery, and you may shower at that time.  You have steri-strips (small skin tapes) in place directly over the incision.  These strips should be left on the skin for 7-10 days.   °b. DERMABOND/SKIN GLUE - you may shower in 24 hours.  The glue will flake off over the next 2-3 weeks. °10. Any sutures or staples will be removed at the office during your follow-up visit. ° °ACTIVITIES °11. You may resume regular (light) daily activities beginning the next day--such as daily self-care, walking, climbing stairs--gradually increasing activities as tolerated.  You may have sexual intercourse when it is comfortable.  Refrain from any heavy lifting or straining until approved by your doctor. °a. You may drive when you are no longer taking prescription pain medication, you can comfortably wear a seatbelt, and you can safely maneuver your car and apply brakes. ° °FOLLOW-UP °12. You should see your doctor in the office for a follow-up appointment approximately 2-3 weeks after your surgery.  You should have been given your post-op/follow-up appointment when   your surgery was scheduled.  If you did not receive a post-op/follow-up appointment, make sure that you call for this appointment within a day or two after you arrive home to insure a convenient appointment time. ° ° °WHEN TO CALL YOUR DOCTOR: °1. Fever over 101.0 °2. Inability to urinate °3. Continued bleeding from  incision. °4. Increased pain, redness, or drainage from the incision. °5. Increasing abdominal pain ° °The clinic staff is available to answer your questions during regular business hours.  Please don’t hesitate to call and ask to speak to one of the nurses for clinical concerns.  If you have a medical emergency, go to the nearest emergency room or call 911.  A surgeon from [facility] is always on call at the hospital. °[address], [address], [place], [place]  [address] ? [address] [address], [place],    [address] °[phone] ? [phone] ? FAX [phone] ° °••••••••• ° ° °Managing Your Pain After Surgery Without Opioids ° ° ° °Thank you for participating in our program to help patients manage their pain after surgery without opioids. This is part of our effort to provide you with the best care possible, without exposing you or your family to the risk that opioids pose. ° °What pain can I expect after surgery? °You can expect to have some pain after surgery. This is normal. The pain is typically worse the day after surgery, and quickly begins to get better. °Many studies have found that many patients are able to manage their pain after surgery with Over-the-Counter (OTC) medications such as Tylenol and Motrin. If you have a condition that does not allow you to take Tylenol or Motrin, notify your surgical team. ° °How will I manage my pain? °The best strategy for controlling your pain after surgery is around the clock pain control with Tylenol (acetaminophen) and Motrin (ibuprofen or Advil). Alternating these medications with each other allows you to maximize your pain control. In addition to Tylenol and Motrin, you can use heating pads or ice packs on your incisions to help reduce your pain. ° °How will I alternate your regular strength over-the-counter pain medication? °You will take a dose of pain medication every three hours. °; Start by taking 650 mg of Tylenol (2 pills of 325  mg) °; 3 hours later take 600 mg of Motrin (3 pills of 200 mg) °; 3 hours after taking the Motrin take 650 mg of Tylenol °; 3 hours after that take 600 mg of Motrin. ° ° °- 1 - ° °See example - if your first dose of Tylenol is at 12:00 PM ° ° °12:00 PM Tylenol 650 mg (2 pills of 325 mg)  °3:00 PM Motrin 600 mg (3 pills of 200 mg)  °6:00 PM Tylenol 650 mg (2 pills of 325 mg)  °9:00 PM Motrin 600 mg (3 pills of 200 mg)  °Continue alternating every 3 hours  ° °We recommend that you follow this schedule around-the-clock for at least 3 days after surgery, or until you feel that it is no longer needed. Use the table on the last page of this handout to keep track of the medications you are taking. °Important: °Do not take more than 3000mg of Tylenol or 3200mg of Motrin in a 24-hour period. °Do not take ibuprofen/Motrin if you have a history of bleeding stomach ulcers, severe kidney disease, &/or actively taking a blood thinner ° °What if I still have pain? °If you have pain that is not   controlled with the over-the-counter pain medications (Tylenol and Motrin or Advil) you might have what we call “breakthrough” pain. You will receive a prescription for a small amount of an opioid pain medication such as Oxycodone, Tramadol, or Tylenol with Codeine. Use these opioid pills in the first 24 hours after surgery if you have breakthrough pain. Do not take more than 1 pill every 4-6 hours. ° °If you still have uncontrolled pain after using all opioid pills, don't hesitate to call our staff using the number provided. We will help make sure you are managing your pain in the best way possible, and if necessary, we can provide a prescription for additional pain medication. ° ° °Day 1   ° °Time  °Name of Medication Number of pills taken  °Amount of Acetaminophen  °Pain Level  ° °Comments  °AM PM       °AM PM       °AM PM       °AM PM       °AM PM       °AM PM       °AM PM       °AM PM       °Total Daily amount of Acetaminophen °Do not  take more than  3,000 mg per day    ° ° °Day 2   ° °Time  °Name of Medication Number of pills °taken  °Amount of Acetaminophen  °Pain Level  ° °Comments  °AM PM       °AM PM       °AM PM       °AM PM       °AM PM       °AM PM       °AM PM       °AM PM       °Total Daily amount of Acetaminophen °Do not take more than  3,000 mg per day    ° ° °Day 3   ° °Time  °Name of Medication Number of pills taken  °Amount of Acetaminophen  °Pain Level  ° °Comments  °AM PM       °AM PM       °AM PM       °AM PM       ° ° ° °AM PM       °AM PM       °AM PM       °AM PM       °Total Daily amount of Acetaminophen °Do not take more than  3,000 mg per day    ° ° °Day 4   ° °Time  °Name of Medication Number of pills taken  °Amount of Acetaminophen  °Pain Level  ° °Comments  °AM PM       °AM PM       °AM PM       °AM PM       °AM PM       °AM PM       °AM PM       °AM PM       °Total Daily amount of Acetaminophen °Do not take more than  3,000 mg per day    ° ° °Day 5   ° °Time  °Name of Medication Number °of pills taken  °Amount of Acetaminophen  °Pain Level  ° °Comments  °AM PM       °AM PM       °AM   PM       °AM PM       °AM PM       °AM PM       °AM PM       °AM PM       °Total Daily amount of Acetaminophen °Do not take more than  3,000 mg per day    ° ° ° °Day 6   ° °Time  °Name of Medication Number of pills °taken  °Amount of Acetaminophen  °Pain Level  °Comments  °AM PM       °AM PM       °AM PM       °AM PM       °AM PM       °AM PM       °AM PM       °AM PM       °Total Daily amount of Acetaminophen °Do not take more than  3,000 mg per day    ° ° °Day 7   ° °Time  °Name of Medication Number of pills taken  °Amount of Acetaminophen  °Pain Level  ° °Comments  °AM PM       °AM PM       °AM PM       °AM PM       °AM PM       °AM PM       °AM PM       °AM PM       °Total Daily amount of Acetaminophen °Do not take more than  3,000 mg per day    ° ° ° ° °For additional information about how and where to safely dispose of unused  opioid °medications - https://www.morepowerfulnc.org ° °Disclaimer: This document contains information and/or instructional materials adapted from [facility] for the typical patient with your condition. It does not replace medical advice from your health care provider because your experience may differ from that of the °typical patient. Talk to your health care provider if you have any questions about this °document, your condition or your treatment plan. °Adapted from [facility] ° ° °Gallbladder Eating Plan °If you have a gallbladder condition, you may have trouble digesting fats. Eating a low-fat diet can help reduce your symptoms, and may be helpful before and after having surgery to remove your gallbladder (cholecystectomy). Your health care provider may recommend that you work with a diet and nutrition specialist (dietitian) to help you reduce the amount of fat in your diet. °What are tips for following this plan? °General guidelines °· Limit your fat intake to less than 30% of your total daily calories. If you eat around 1,800 calories each day, this is less than 60 grams (g) of fat per day. °· Fat is an important part of a healthy diet. Eating a low-fat diet can make it hard to maintain a healthy body weight. Ask your dietitian how much fat, calories, and other nutrients you need each day. °· Eat small, frequent meals throughout the day instead of three large meals. °· Drink at least 8-10 cups of fluid a day. Drink enough fluid to keep your urine clear or pale yellow. °· Limit alcohol intake to no more than 1 drink a day for nonpregnant women and 2 drinks a day for men. One drink equals 12 oz of beer, 5 oz of wine, or 1½ oz of hard liquor. °Reading food labels °· Check Nutrition Facts on food labels   for the amount of fat per serving. Choose foods with less than 3 grams of fat per serving. °Shopping °· Choose nonfat and low-fat healthy foods. Look for the words “nonfat,” “low fat,” or “fat  free.” °· Avoid buying processed or prepackaged foods. °Cooking °· Cook using low-fat methods, such as baking, broiling, grilling, or boiling. °· Cook with small amounts of healthy fats, such as olive oil, grapeseed oil, canola oil, or sunflower oil. °What foods are recommended? ° °· All fresh, frozen, or canned fruits and vegetables. °· Whole grains. °· Low-fat or non-fat (skim) milk and yogurt. °· Lean meat, skinless poultry, fish, eggs, and beans. °· Low-fat protein supplement powders or drinks. °· Spices and herbs. °What foods are not recommended? °· High-fat foods. These include baked goods, fast food, fatty cuts of meat, ice cream, french toast, sweet rolls, pizza, cheese bread, foods covered with butter, creamy sauces, or cheese. °· Fried foods. These include french fries, tempura, battered fish, breaded chicken, fried breads, and sweets. °· Foods with strong odors. °· Foods that cause bloating and gas. °Summary °· A low-fat diet can be helpful if you have a gallbladder condition, or before and after gallbladder surgery. °· Limit your fat intake to less than 30% of your total daily calories. This is about 60 g of fat if you eat 1,800 calories each day. °· Eat small, frequent meals throughout the day instead of three large meals. °This information is not intended to replace advice given to you by your health care provider. Make sure you discuss any questions you have with your health care provider. °Document Revised: 12/05/2018 Document Reviewed: 09/21/2016 °[employer] Patient Education © 2020 [employer] ° °
